# Patient Record
Sex: Female | Born: 1990 | Race: Black or African American | Hispanic: No | Marital: Single | Smoking: Current every day smoker
Health system: Southern US, Community
[De-identification: ages and names within clinical notes are randomized; demographics above are authoritative.]

## PROBLEM LIST (undated history)

## (undated) DIAGNOSIS — D573 Sickle-cell trait: Secondary | ICD-10-CM

## (undated) DIAGNOSIS — F319 Bipolar disorder, unspecified: Secondary | ICD-10-CM

## (undated) DIAGNOSIS — N879 Dysplasia of cervix uteri, unspecified: Secondary | ICD-10-CM

## (undated) DIAGNOSIS — F419 Anxiety disorder, unspecified: Secondary | ICD-10-CM

## (undated) DIAGNOSIS — F329 Major depressive disorder, single episode, unspecified: Secondary | ICD-10-CM

## (undated) DIAGNOSIS — T7840XA Allergy, unspecified, initial encounter: Secondary | ICD-10-CM

## (undated) DIAGNOSIS — F32A Depression, unspecified: Secondary | ICD-10-CM

## (undated) DIAGNOSIS — D649 Anemia, unspecified: Secondary | ICD-10-CM

## (undated) HISTORY — DX: Bipolar disorder, unspecified: F31.9

## (undated) HISTORY — DX: Dysplasia of cervix uteri, unspecified: N87.9

## (undated) HISTORY — PX: GYNECOLOGIC CRYOSURGERY: SHX857

## (undated) HISTORY — DX: Major depressive disorder, single episode, unspecified: F32.9

## (undated) HISTORY — DX: Allergy, unspecified, initial encounter: T78.40XA

## (undated) HISTORY — DX: Anxiety disorder, unspecified: F41.9

## (undated) HISTORY — DX: Sickle-cell trait: D57.3

## (undated) HISTORY — DX: Depression, unspecified: F32.A

## (undated) HISTORY — DX: Anemia, unspecified: D64.9

## (undated) HISTORY — PX: COLPOSCOPY: SHX161

---
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
## 2010-09-23 ENCOUNTER — Ambulatory Visit: Payer: Self-pay | Admitting: Gynecology

## 2011-02-28 ENCOUNTER — Other Ambulatory Visit (HOSPITAL_COMMUNITY)
Admission: RE | Admit: 2011-02-28 | Discharge: 2011-02-28 | Disposition: A | Discharge status: Home / Self Care | Payer: PRIVATE HEALTH INSURANCE | Source: Ambulatory Visit | Attending: Gynecology | Admitting: Gynecology

## 2011-02-28 ENCOUNTER — Ambulatory Visit (INDEPENDENT_AMBULATORY_CARE_PROVIDER_SITE_OTHER): Payer: PRIVATE HEALTH INSURANCE | Admitting: Gynecology

## 2011-02-28 ENCOUNTER — Encounter: Payer: Self-pay | Admitting: Gynecology

## 2011-02-28 VITALS — BP 116/74 | Ht 65.0 in | Wt 145.0 lb

## 2011-02-28 DIAGNOSIS — F329 Major depressive disorder, single episode, unspecified: Secondary | ICD-10-CM | POA: Insufficient documentation

## 2011-02-28 DIAGNOSIS — F319 Bipolar disorder, unspecified: Secondary | ICD-10-CM | POA: Insufficient documentation

## 2011-02-28 DIAGNOSIS — N926 Irregular menstruation, unspecified: Secondary | ICD-10-CM

## 2011-02-28 DIAGNOSIS — D649 Anemia, unspecified: Secondary | ICD-10-CM | POA: Insufficient documentation

## 2011-02-28 DIAGNOSIS — Z01419 Encounter for gynecological examination (general) (routine) without abnormal findings: Secondary | ICD-10-CM | POA: Insufficient documentation

## 2011-02-28 DIAGNOSIS — N920 Excessive and frequent menstruation with regular cycle: Secondary | ICD-10-CM

## 2011-02-28 DIAGNOSIS — Z113 Encounter for screening for infections with a predominantly sexual mode of transmission: Secondary | ICD-10-CM

## 2011-02-28 DIAGNOSIS — Z131 Encounter for screening for diabetes mellitus: Secondary | ICD-10-CM

## 2011-02-28 DIAGNOSIS — Z13220 Encounter for screening for lipoid disorders: Secondary | ICD-10-CM

## 2011-02-28 DIAGNOSIS — D573 Sickle-cell trait: Secondary | ICD-10-CM | POA: Insufficient documentation

## 2011-02-28 DIAGNOSIS — N879 Dysplasia of cervix uteri, unspecified: Secondary | ICD-10-CM | POA: Insufficient documentation

## 2011-02-28 LAB — URINALYSIS W MICROSCOPIC + REFLEX CULTURE
Bilirubin Urine: NEGATIVE
Urobilinogen, UA: 1 mg/dL (ref 0.0–1.0)

## 2011-02-28 NOTE — Progress Notes (Signed)
Regina Rojas 1990/10/10 [id number]        21 year old G2 P0 SAB 2 female presents for her annual exam as well as questions about irregular bleeding. Patient has history of using Depo-Provera through 2007 and then discontinuing. She had abnormal Pap smear which lead to cryosurgery in 2008 and it is unclear whether she had routine follow up since then as far as Pap smears are concerned. She notes now that her menses are every 2-3 months and when they come they're very heavy where she has bleedthrough episodes. She did achieve pregnancy in 2010 which lead to early SAB and no D&C and a separate pregnancy last year in 2012 at "four months" where she had a follow up D&C.  She is intermittently using condoms for contraception and would accept pregnancy if it occurred.  Past medical history,surgical history, medications, allergies, family history and social history were all reviewed and documented in the EPIC chart. ROS:  Was performed and pertinent positives and negatives are included in the history.  Exam: [name] chaperone present Filed Vitals:   02/28/11 1506  BP: 116/74   General appearance  Normal Skin grossly normal Head/Neck normal with no cervical or supraclavicular adenopathy thyroid normal Lungs  clear Cardiac RR, without RMG Abdominal  soft, nontender, without masses, organomegaly or hernia Breasts  examined lying and sitting without masses, retractions, discharge or axillary adenopathy. Pelvic  Ext/BUS/vagina  normal   Cervix  normal  Pap done GC and Chlamydia screen  Uterus  anteverted, normal size, shape and contour, midline and mobile nontender   Adnexa  Without masses or tenderness    Anus and perineum  normal   Rectovaginal  normal sphincter tone without palpated masses or tenderness.    Assessment/Plan:  21 y.o. female for annual exam.    1. Irregular/heavy menses. We'll start with baseline labs to include FSH TSH prolactin and hCG. Rule out structural abnormalities with  sonohysterogram and she will schedule this. 2. History cryosurgery 2008.  Follow up not well documented. Pap done today and we'll recommend annual cytology for now. 3. STD screening. A GC and Chlamydia screen were done. I offered serum screening she declined stating she recently had a negative HIV declined other blood work screening. 4. Birth control. Using condoms for now. Discussed and offered alternatives. Need to be on a multivitamin with folic acid if she chooses not to use contraception reviewed with her. We'll further discuss in follow up with her hormone and ultrasound studies. 5. Breast health. SBE monthly reviewed. 6. Health maintenance. Baseline CBC glucose lipid profile urinalysis ordered.     Regina Lords MD, 4:33 PM 02/28/2011

## 2011-02-28 NOTE — Patient Instructions (Signed)
Follow up for ultrasound and hormone blood work.

## 2011-03-01 LAB — LIPID PANEL
LDL Cholesterol: 103 mg/dL — ABNORMAL HIGH (ref 0–99)
Total CHOL/HDL Ratio: 3.3 Ratio

## 2011-03-01 LAB — TSH: TSH: 1.536 u[IU]/mL (ref 0.350–4.500)

## 2011-03-01 LAB — CBC WITH DIFFERENTIAL/PLATELET
Basophils Absolute: 0 10*3/uL (ref 0.0–0.1)
Basophils Relative: 0 % (ref 0–1)
Eosinophils Absolute: 0 10*3/uL (ref 0.0–0.7)
Eosinophils Relative: 0 % (ref 0–5)
Lymphocytes Relative: 24 % (ref 12–46)
Lymphs Abs: 2.3 10*3/uL (ref 0.7–4.0)
MCHC: 33.4 g/dL (ref 30.0–36.0)
Neutrophils Relative %: 70 % (ref 43–77)
Platelets: 292 10*3/uL (ref 150–400)
RDW: 13.1 % (ref 11.5–15.5)

## 2011-03-01 LAB — PROLACTIN: Prolactin: 13.2 ng/mL

## 2011-03-01 LAB — GLUCOSE, RANDOM: Glucose, Bld: 79 mg/dL (ref 70–99)

## 2013-11-18 ENCOUNTER — Encounter: Payer: Self-pay | Admitting: Gynecology

## 2014-06-24 ENCOUNTER — Encounter (HOSPITAL_BASED_OUTPATIENT_CLINIC_OR_DEPARTMENT_OTHER): Payer: Self-pay | Admitting: *Deleted

## 2014-06-24 ENCOUNTER — Emergency Department (HOSPITAL_BASED_OUTPATIENT_CLINIC_OR_DEPARTMENT_OTHER)
Admission: EM | Admit: 2014-06-24 | Discharge: 2014-06-24 | Disposition: A | Discharge status: Home / Self Care | Payer: PRIVATE HEALTH INSURANCE | Attending: Emergency Medicine | Admitting: Emergency Medicine

## 2014-06-24 DIAGNOSIS — Z862 Personal history of diseases of the blood and blood-forming organs and certain disorders involving the immune mechanism: Secondary | ICD-10-CM | POA: Insufficient documentation

## 2014-06-24 DIAGNOSIS — K002 Abnormalities of size and form of teeth: Secondary | ICD-10-CM | POA: Insufficient documentation

## 2014-06-24 DIAGNOSIS — Z8659 Personal history of other mental and behavioral disorders: Secondary | ICD-10-CM | POA: Insufficient documentation

## 2014-06-24 DIAGNOSIS — K088 Other specified disorders of teeth and supporting structures: Secondary | ICD-10-CM | POA: Insufficient documentation

## 2014-06-24 DIAGNOSIS — Z8742 Personal history of other diseases of the female genital tract: Secondary | ICD-10-CM | POA: Insufficient documentation

## 2014-06-24 DIAGNOSIS — K0381 Cracked tooth: Secondary | ICD-10-CM | POA: Insufficient documentation

## 2014-06-24 DIAGNOSIS — R509 Fever, unspecified: Secondary | ICD-10-CM | POA: Insufficient documentation

## 2014-06-24 DIAGNOSIS — K029 Dental caries, unspecified: Secondary | ICD-10-CM | POA: Insufficient documentation

## 2014-06-24 DIAGNOSIS — Z720 Tobacco use: Secondary | ICD-10-CM | POA: Insufficient documentation

## 2014-06-24 DIAGNOSIS — K0889 Other specified disorders of teeth and supporting structures: Secondary | ICD-10-CM

## 2014-06-24 MED ORDER — OXYCODONE-ACETAMINOPHEN 5-325 MG PO TABS: 2.0000 | ORAL_TABLET | ORAL | Status: DC | PRN

## 2014-06-24 MED ORDER — OXYCODONE-ACETAMINOPHEN 5-325 MG PO TABS
2.0000 | ORAL_TABLET | Freq: Once | ORAL | Status: AC
  Administered 2014-06-24: 2 via ORAL
  Filled 2014-06-24: qty 2

## 2014-06-24 MED ORDER — AMOXICILLIN 500 MG PO CAPS: 500.0000 mg | ORAL_CAPSULE | Freq: Three times a day (TID) | ORAL | Status: DC

## 2014-06-24 NOTE — ED Provider Notes (Signed)
CSN: [id number]     Arrival date & time 06/24/14  1958 History   None    Chief Complaint  Patient presents with  . Dental Pain     (Consider location/radiation/quality/duration/timing/severity/associated sxs/prior Treatment) Patient is a 24 y.o. female presenting with tooth pain. The history is provided by the patient. No language interpreter was used.  Dental Pain Associated symptoms: fever   Associated symptoms: no drooling    Regina Rojas is a 24 year old female who presents for dental pain that has been intermittent for the past couple of weeks but worse and constant in the last 2 days. She states that she has had similar pain on the other side of her mouth and was given and about for tooth abscess. She would like a referral to a dentist also. She states she has tried ibuprofen and Orajel with temporary relief. She states she has been having intermittent subjective fever. She denies any chills, drooling, throat pain, facial swelling, tongue swelling, difficulty breathing, nausea, vomiting. Denies any new tooth avulsion or fracture. Past Medical History  Diagnosis Date  . Sickle cell trait   . Depression   . Bipolar disorder   . Anemia   . Cervical dysplasia    Past Surgical History  Procedure Laterality Date  . Gynecologic cryosurgery    . Colposcopy     Family History  Problem Relation Age of Onset  . Diabetes Mother   . Hypertension Mother   . Hypertension Sister   . Heart disease Maternal Grandmother    History  Substance Use Topics  . Smoking status: Current Every Day Smoker -- 1.00 packs/day    Types: Cigarettes  . Smokeless tobacco: Not on file  . Alcohol Use: Yes     Comment: occas   OB History    Gravida Para Term Preterm AB TAB SAB Ectopic Multiple Living   2    2  2    0     Review of Systems  Constitutional: Positive for fever. Negative for chills.  HENT: Positive for dental problem. Negative for drooling.       Allergies  Review of patient's  allergies indicates no known allergies.  Home Medications   Prior to Admission medications   Medication Sig Start Date End Date Taking? Authorizing Provider  amoxicillin (AMOXIL) 500 MG capsule Take 1 capsule (500 mg total) by mouth 3 (three) times daily. 06/24/14   Aliviya Schoeller Patel-Mills, PA-C  oxyCODONE-acetaminophen (PERCOCET/ROXICET) 5-325 MG per tablet Take 2 tablets by mouth every 4 (four) hours as needed for severe pain. 06/24/14   Adolpho Meenach Patel-Mills, PA-C   BP 116/83 mmHg  Pulse 63  Temp(Src) 98 F (36.7 C) (Oral)  Resp 20  Wt 155 lb (70.308 kg)  SpO2 98% Physical Exam  Constitutional: She is oriented to person, place, and time. She appears well-developed and well-nourished.  HENT:  Head: Normocephalic and atraumatic.  Mouth/Throat: Uvula is midline, oropharynx is clear and moist and mucous membranes are normal. Abnormal dentition. Dental caries present. No dental abscesses or uvula swelling. No oropharyngeal exudate, posterior oropharyngeal edema, posterior oropharyngeal erythema or tonsillar abscesses.    Cardiovascular: Normal rate and regular rhythm.   Pulmonary/Chest: Effort normal.  Neurological: She is alert and oriented to person, place, and time.  Skin: Skin is warm and dry.  Nursing note and vitals reviewed.   ED Course  Procedures (including critical care time) Labs Review Labs Reviewed - No data to display  Imaging Review No results found.   EKG Interpretation  None      MDM   Final diagnoses:  Pain, dental  Patient presents for dental pain that has been ongoing for weeks but worse in the last 2 days. She is in no acute distress. Her oxygen is 98% on room air. She is currently afebrile with no submandibular tenderness, no facial swelling, no drooling, no shortness of breath, no difficulty breathing. I have prescribed amoxicillin and Percocet, and she can follow-up with [facility]. She verbally agrees with the plan.    Catha Gosselin, PA-C 06/24/14  1610  Gwyneth Sprout, MD 06/26/14 [phone]

## 2014-06-24 NOTE — Discharge Instructions (Signed)
Dental Pain Take medications as prescribed. Follow-up with Edmon CrapeJanna Civil for your dental pain. A tooth ache may be caused by cavities (tooth decay). Cavities expose the nerve of the tooth to air and hot or cold temperatures. It may come from an infection or abscess (also called a boil or furuncle) around your tooth. It is also often caused by dental caries (tooth decay). This causes the pain you are having. DIAGNOSIS  Your caregiver can diagnose this problem by exam. TREATMENT   If caused by an infection, it may be treated with medications which kill germs (antibiotics) and pain medications as prescribed by your caregiver. Take medications as directed.  Only take over-the-counter or prescription medicines for pain, discomfort, or fever as directed by your caregiver.  Whether the tooth ache today is caused by infection or dental disease, you should see your dentist as soon as possible for further care. SEEK MEDICAL CARE IF: The exam and treatment you received today has been provided on an emergency basis only. This is not a substitute for complete medical or dental care. If your problem worsens or new problems (symptoms) appear, and you are unable to meet with your dentist, call or return to this location. SEEK IMMEDIATE MEDICAL CARE IF:   You have a fever.  You develop redness and swelling of your face, jaw, or neck.  You are unable to open your mouth.  You have severe pain uncontrolled by pain medicine. MAKE SURE YOU:   Understand these instructions.  Will watch your condition.  Will get help right away if you are not doing well or get worse. Document Released: 01/03/2005 Document Revised: 03/28/2011 Document Reviewed: 08/22/2007 [facility] Patient Information 2015 [place]. This information is not intended to replace advice given to you by your health care provider. Make sure you discuss any questions you have with your health care provider.

## 2014-06-24 NOTE — ED Notes (Signed)
Dental pain. 

## 2014-06-24 NOTE — ED Notes (Signed)
PA at bedside for assessment

## 2014-07-02 ENCOUNTER — Emergency Department (HOSPITAL_BASED_OUTPATIENT_CLINIC_OR_DEPARTMENT_OTHER)
Admission: EM | Admit: 2014-07-02 | Discharge: 2014-07-02 | Disposition: A | Discharge status: Home / Self Care | Payer: Self-pay | Attending: Emergency Medicine | Admitting: Emergency Medicine

## 2014-07-02 ENCOUNTER — Emergency Department (HOSPITAL_BASED_OUTPATIENT_CLINIC_OR_DEPARTMENT_OTHER): Payer: Self-pay

## 2014-07-02 ENCOUNTER — Encounter (HOSPITAL_BASED_OUTPATIENT_CLINIC_OR_DEPARTMENT_OTHER): Payer: Self-pay | Admitting: *Deleted

## 2014-07-02 DIAGNOSIS — S93402A Sprain of unspecified ligament of left ankle, initial encounter: Secondary | ICD-10-CM | POA: Insufficient documentation

## 2014-07-02 DIAGNOSIS — Z792 Long term (current) use of antibiotics: Secondary | ICD-10-CM | POA: Insufficient documentation

## 2014-07-02 DIAGNOSIS — W1842XA Slipping, tripping and stumbling without falling due to stepping into hole or opening, initial encounter: Secondary | ICD-10-CM | POA: Insufficient documentation

## 2014-07-02 DIAGNOSIS — Z8659 Personal history of other mental and behavioral disorders: Secondary | ICD-10-CM | POA: Insufficient documentation

## 2014-07-02 DIAGNOSIS — Z880 Allergy status to penicillin: Secondary | ICD-10-CM | POA: Insufficient documentation

## 2014-07-02 DIAGNOSIS — Y998 Other external cause status: Secondary | ICD-10-CM | POA: Insufficient documentation

## 2014-07-02 DIAGNOSIS — Z720 Tobacco use: Secondary | ICD-10-CM | POA: Insufficient documentation

## 2014-07-02 DIAGNOSIS — Y9289 Other specified places as the place of occurrence of the external cause: Secondary | ICD-10-CM | POA: Insufficient documentation

## 2014-07-02 DIAGNOSIS — Z8742 Personal history of other diseases of the female genital tract: Secondary | ICD-10-CM | POA: Insufficient documentation

## 2014-07-02 DIAGNOSIS — Y9389 Activity, other specified: Secondary | ICD-10-CM | POA: Insufficient documentation

## 2014-07-02 DIAGNOSIS — Z862 Personal history of diseases of the blood and blood-forming organs and certain disorders involving the immune mechanism: Secondary | ICD-10-CM | POA: Insufficient documentation

## 2014-07-02 MED ORDER — NAPROXEN 500 MG PO TABS
500.0000 mg | ORAL_TABLET | Freq: Two times a day (BID) | ORAL | Status: DC
Start: 2014-07-02 — End: 2014-09-07

## 2014-07-02 MED ORDER — NAPROXEN 250 MG PO TABS
500.0000 mg | ORAL_TABLET | Freq: Once | ORAL | Status: AC
  Administered 2014-07-02: 500 mg via ORAL
  Filled 2014-07-02: qty 2

## 2014-07-02 NOTE — ED Provider Notes (Signed)
CSN: [id number]     Arrival date & time 07/02/14  1345 History   First MD Initiated Contact with Patient 07/02/14 1400     Chief Complaint  Patient presents with  . Ankle Injury    left     (Consider location/radiation/quality/duration/timing/severity/associated sxs/prior Treatment) HPI Comments: 24 year old female complaining of left ankle pain after stepping in a hold of by her dog last night causing her to twist it. States she immediately became in severe pain, and her ankle started to swell. Pain 8/10, worse with walking or movement. She has not tried any alleviating factors other than no pressure. No numbness or tingling.  Patient is a 24 y.o. female presenting with lower extremity injury. The history is provided by the patient.  Ankle Injury Pertinent negatives include no numbness.    Past Medical History  Diagnosis Date  . Sickle cell trait   . Depression   . Bipolar disorder   . Anemia   . Cervical dysplasia    Past Surgical History  Procedure Laterality Date  . Gynecologic cryosurgery    . Colposcopy     Family History  Problem Relation Age of Onset  . Diabetes Mother   . Hypertension Mother   . Hypertension Sister   . Heart disease Maternal Grandmother    History  Substance Use Topics  . Smoking status: Current Every Day Smoker -- 1.00 packs/day    Types: Cigarettes  . Smokeless tobacco: Never Used  . Alcohol Use: Yes     Comment: occas   OB History    Gravida Para Term Preterm AB TAB SAB Ectopic Multiple Living   2    2  2    0     Review of Systems  Constitutional: Negative.   Musculoskeletal:       + L ankle pain and swelling.  Skin: Negative for color change.  Neurological: Negative for numbness.      Allergies  Penicillins  Home Medications   Prior to Admission medications   Medication Sig Start Date End Date Taking? Authorizing Provider  amoxicillin (AMOXIL) 500 MG capsule Take 1 capsule (500 mg total) by mouth 3 (three) times daily.  06/24/14   Hanna Patel-Mills, PA-C  naproxen (NAPROSYN) 500 MG tablet Take 1 tablet (500 mg total) by mouth 2 (two) times daily. 07/02/14   Kathrynn Speed, PA-C  oxyCODONE-acetaminophen (PERCOCET/ROXICET) 5-325 MG per tablet Take 2 tablets by mouth every 4 (four) hours as needed for severe pain. 06/24/14   Hanna Patel-Mills, PA-C   BP 120/81 mmHg  Pulse 82  Temp(Src) 98.6 F (37 C) (Oral)  Resp 18  Ht 5\' 2"  (1.575 m)  Wt 172 lb (78.019 kg)  BMI 31.45 kg/m2  SpO2 100% Physical Exam  Constitutional: She is oriented to person, place, and time. She appears well-developed and well-nourished. No distress.  HENT:  Head: Normocephalic and atraumatic.  Mouth/Throat: Oropharynx is clear and moist.  Eyes: Conjunctivae and EOM are normal.  Neck: Normal range of motion. Neck supple.  Cardiovascular: Normal rate, regular rhythm and normal heart sounds.   Pulmonary/Chest: Effort normal and breath sounds normal. No respiratory distress.  Musculoskeletal:  L ankle TTP over lateral malleolus and AITFL with moderate swelling. ROM limited by pain. Able to wiggle toes. Achilles tendon normal. +2 PT/DP pulse.  Neurological: She is alert and oriented to person, place, and time. No sensory deficit.  Skin: Skin is warm and dry.  Psychiatric: She has a normal mood and affect. Her behavior  is normal.  Nursing note and vitals reviewed.   ED Course  Procedures (including critical care time) Labs Review Labs Reviewed - No data to display  Imaging Review Dg Ankle Complete Left  07/02/2014   CLINICAL DATA:  Rolled left ankle today stepping in hole. Left ankle pain. Initial encounter.  EXAM: LEFT ANKLE COMPLETE - 3+ VIEW  COMPARISON:  None.  FINDINGS: Lateral soft tissue swelling. There is no evidence of fracture, dislocation, or joint effusion.  IMPRESSION: Soft tissue swelling without osseous abnormality.   Electronically Signed   By: Marnee Spring M.D.   On: 07/02/2014 14:17     EKG Interpretation None       MDM   Final diagnoses:  Left ankle sprain, initial encounter   Neurovascularly intact. X-ray showing soft tissue swelling without rashes abnormality. Aircast splint applied and crutches given. Advised rice and NSAIDs. Stable for discharge. Return precautions given. Patient states understanding of treatment care plan and is agreeable.   Kathrynn Speed, PA-C 07/02/14 1427  Gilda Crease, MD 07/04/14 2052

## 2014-07-02 NOTE — ED Notes (Addendum)
Patient states she stepped in a hole last night and twisted her left ankle.  Moderate swelling noted on lateral malleolus.

## 2014-07-02 NOTE — Discharge Instructions (Signed)
Take naproxen as prescribed. Rest, ice and elevate your ankle. ° °Ankle Sprain °An ankle sprain is an injury to the strong, fibrous tissues (ligaments) that hold the bones of your ankle joint together.  °CAUSES °An ankle sprain is usually caused by a fall or by twisting your ankle. Ankle sprains most commonly occur when you step on the outer edge of your foot, and your ankle turns inward. People who participate in sports are more prone to these types of injuries.  °SYMPTOMS  °· Pain in your ankle. The pain may be present at rest or only when you are trying to stand or walk. °· Swelling. °· Bruising. Bruising may develop immediately or within 1 to 2 days after your injury. °· Difficulty standing or walking, particularly when turning corners or changing directions. °DIAGNOSIS  °Your caregiver will ask you details about your injury and perform a physical exam of your ankle to determine if you have an ankle sprain. During the physical exam, your caregiver will press on and apply pressure to specific areas of your foot and ankle. Your caregiver will try to move your ankle in certain ways. An X-ray exam may be done to be sure a bone was not broken or a ligament did not separate from one of the bones in your ankle (avulsion fracture).  °TREATMENT  °Certain types of braces can help stabilize your ankle. Your caregiver can make a recommendation for this. Your caregiver may recommend the use of medicine for pain. If your sprain is severe, your caregiver may refer you to a surgeon who helps to restore function to parts of your skeletal system (orthopedist) or a physical therapist. °HOME CARE INSTRUCTIONS  °· Apply ice to your injury for 1-2 days or as directed by your caregiver. Applying ice helps to reduce inflammation and pain. °· Put ice in a plastic bag. °· Place a towel between your skin and the bag. °· Leave the ice on for 15-20 minutes at a time, every 2 hours while you are awake. °· Only take over-the-counter or  prescription medicines for pain, discomfort, or fever as directed by your caregiver. °· Elevate your injured ankle above the level of your heart as much as possible for 2-3 days. °· If your caregiver recommends crutches, use them as instructed. Gradually put weight on the affected ankle. Continue to use crutches or a cane until you can walk without feeling pain in your ankle. °· If you have a plaster splint, wear the splint as directed by your caregiver. Do not rest it on anything harder than a pillow for the first 24 hours. Do not put weight on it. Do not get it wet. You may take it off to take a shower or bath. °· You may have been given an elastic bandage to wear around your ankle to provide support. If the elastic bandage is too tight (you have numbness or tingling in your foot or your foot becomes cold and blue), adjust the bandage to make it comfortable. °· If you have an air splint, you may blow more air into it or let air out to make it more comfortable. You may take your splint off at night and before taking a shower or bath. Wiggle your toes in the splint several times per day to decrease swelling. °SEEK MEDICAL CARE IF:  °· You have rapidly increasing bruising or swelling. °· Your toes feel extremely cold or you lose feeling in your foot. °· Your pain is not relieved with medicine. °SEEK   IMMEDIATE MEDICAL CARE IF:  Your toes are numb or blue.  You have severe pain that is increasing. MAKE SURE YOU:   Understand these instructions.  Will watch your condition.  Will get help right away if you are not doing well or get worse. Document Released: 01/03/2005 Document Revised: 09/28/2011 Document Reviewed: 01/15/2011 [facility] Patient Information 2015 [place], [place]. This information is not intended to replace advice given to you by your health care provider. Make sure you discuss any questions you have with your health care provider. RICE: Routine Care for Injuries The routine care of many  injuries includes Rest, Ice, Compression, and Elevation (RICE). HOME CARE INSTRUCTIONS  Rest is needed to allow your body to heal. Routine activities can usually be resumed when comfortable. Injured tendons and bones can take up to 6 weeks to heal. Tendons are the cord-like structures that attach muscle to bone.  Ice following an injury helps keep the swelling down and reduces pain.  Put ice in a plastic bag.  Place a towel between your skin and the bag.  Leave the ice on for 15-20 minutes, 3-4 times a day, or as directed by your health care provider. Do this while awake, for the first 24 to 48 hours. After that, continue as directed by your caregiver.  Compression helps keep swelling down. It also gives support and helps with discomfort. If an elastic bandage has been applied, it should be removed and reapplied every 3 to 4 hours. It should not be applied tightly, but firmly enough to keep swelling down. Watch fingers or toes for swelling, bluish discoloration, coldness, numbness, or excessive pain. If any of these problems occur, remove the bandage and reapply loosely. Contact your caregiver if these problems continue.  Elevation helps reduce swelling and decreases pain. With extremities, such as the arms, hands, legs, and feet, the injured area should be placed near or above the level of the heart, if possible. SEEK IMMEDIATE MEDICAL CARE IF:  You have persistent pain and swelling.  You develop redness, numbness, or unexpected weakness.  Your symptoms are getting worse rather than improving after several days. These symptoms may indicate that further evaluation or further X-rays are needed. Sometimes, X-rays may not show a small broken bone (fracture) until 1 week or 10 days later. Make a follow-up appointment with your caregiver. Ask when your X-ray results will be ready. Make sure you get your X-ray results. Document Released: 04/17/2000 Document Revised: 01/08/2013 Document Reviewed:  06/04/2010 [facility] Patient Information 2015 [place], [place]. This information is not intended to replace advice given to you by your health care provider. Make sure you discuss any questions you have with your health care provider.

## 2014-09-06 ENCOUNTER — Emergency Department (HOSPITAL_BASED_OUTPATIENT_CLINIC_OR_DEPARTMENT_OTHER)
Admission: EM | Admit: 2014-09-06 | Discharge: 2014-09-07 | Disposition: A | Discharge status: Home / Self Care | Payer: Self-pay | Attending: Emergency Medicine | Admitting: Emergency Medicine

## 2014-09-06 ENCOUNTER — Encounter (HOSPITAL_BASED_OUTPATIENT_CLINIC_OR_DEPARTMENT_OTHER): Payer: Self-pay | Admitting: Emergency Medicine

## 2014-09-06 DIAGNOSIS — Z862 Personal history of diseases of the blood and blood-forming organs and certain disorders involving the immune mechanism: Secondary | ICD-10-CM | POA: Insufficient documentation

## 2014-09-06 DIAGNOSIS — Z87410 Personal history of cervical dysplasia: Secondary | ICD-10-CM | POA: Insufficient documentation

## 2014-09-06 DIAGNOSIS — R112 Nausea with vomiting, unspecified: Secondary | ICD-10-CM | POA: Insufficient documentation

## 2014-09-06 DIAGNOSIS — Z720 Tobacco use: Secondary | ICD-10-CM | POA: Insufficient documentation

## 2014-09-06 DIAGNOSIS — Z791 Long term (current) use of non-steroidal anti-inflammatories (NSAID): Secondary | ICD-10-CM | POA: Insufficient documentation

## 2014-09-06 DIAGNOSIS — Z8659 Personal history of other mental and behavioral disorders: Secondary | ICD-10-CM | POA: Insufficient documentation

## 2014-09-06 DIAGNOSIS — Z880 Allergy status to penicillin: Secondary | ICD-10-CM | POA: Insufficient documentation

## 2014-09-06 DIAGNOSIS — Z3202 Encounter for pregnancy test, result negative: Secondary | ICD-10-CM | POA: Insufficient documentation

## 2014-09-06 DIAGNOSIS — Z792 Long term (current) use of antibiotics: Secondary | ICD-10-CM | POA: Insufficient documentation

## 2014-09-06 LAB — COMPREHENSIVE METABOLIC PANEL
ALBUMIN: 4.2 g/dL (ref 3.5–5.0)
ALK PHOS: 71 U/L (ref 38–126)
ALT: 24 U/L (ref 14–54)
AST: 24 U/L (ref 15–41)
Anion gap: 11 (ref 5–15)
BUN: 11 mg/dL (ref 6–20)
CALCIUM: 9.3 mg/dL (ref 8.9–10.3)
CHLORIDE: 102 mmol/L (ref 101–111)
CO2: 25 mmol/L (ref 22–32)
Creatinine, Ser: 0.71 mg/dL (ref 0.44–1.00)
GFR calc Af Amer: >60 mL/min (ref >60)
GFR calc non Af Amer: >60 mL/min (ref >60)
GLUCOSE: 101 mg/dL — AB (ref 65–99)
Potassium: 3.4 mmol/L — ABNORMAL LOW (ref 3.5–5.1)
SODIUM: 138 mmol/L (ref 135–145)
Total Bilirubin: 0.3 mg/dL (ref 0.3–1.2)
Total Protein: 8 g/dL (ref 6.5–8.1)

## 2014-09-06 LAB — CBC
HCT: 41.3 % (ref 36.0–46.0)
Hemoglobin: 13.8 g/dL (ref 12.0–15.0)
MCH: 28.9 pg (ref 26.0–34.0)
MCHC: 33.4 g/dL (ref 30.0–36.0)
MCV: 86.4 fL (ref 78.0–100.0)
Platelets: 296 10*3/uL (ref 150–400)
RBC: 4.78 MIL/uL (ref 3.87–5.11)
RDW: 14.5 % (ref 11.5–15.5)
WBC: 9.1 10*3/uL (ref 4.0–10.5)

## 2014-09-06 LAB — URINALYSIS, ROUTINE W REFLEX MICROSCOPIC
Bilirubin Urine: NEGATIVE
GLUCOSE, UA: NEGATIVE mg/dL
HGB URINE DIPSTICK: NEGATIVE
Ketones, ur: NEGATIVE mg/dL
LEUKOCYTES UA: NEGATIVE
Nitrite: NEGATIVE
PH: 5.5 (ref 5.0–8.0)
PROTEIN: NEGATIVE mg/dL
Specific Gravity, Urine: 1.025 (ref 1.005–1.030)
Urobilinogen, UA: 0.2 mg/dL (ref 0.0–1.0)

## 2014-09-06 LAB — DIFFERENTIAL
BASOS ABS: 0 10*3/uL (ref 0.0–0.1)
Basophils Relative: 0 % (ref 0–1)
Eosinophils Absolute: 0.1 10*3/uL (ref 0.0–0.7)
Eosinophils Relative: 1 % (ref 0–5)
LYMPHS ABS: 3 10*3/uL (ref 0.7–4.0)
Lymphocytes Relative: 33 % (ref 12–46)
MONO ABS: 0.7 10*3/uL (ref 0.1–1.0)
MONOS PCT: 8 % (ref 3–12)
NEUTROS ABS: 5.2 10*3/uL (ref 1.7–7.7)
Neutrophils Relative %: 58 % (ref 43–77)

## 2014-09-06 LAB — LIPASE, BLOOD: LIPASE: 43 U/L (ref 22–51)

## 2014-09-06 LAB — PREGNANCY, URINE: Preg Test, Ur: NEGATIVE

## 2014-09-06 MED ORDER — SODIUM CHLORIDE 0.9 % IV BOLUS (SEPSIS)
1000.0000 mL | Freq: Once | INTRAVENOUS | Status: AC
Start: 2014-09-06 — End: 2014-09-07
  Administered 2014-09-06: 1000 mL via INTRAVENOUS

## 2014-09-06 MED ORDER — ONDANSETRON HCL 4 MG/2ML IJ SOLN
4.0000 mg | Freq: Once | INTRAMUSCULAR | Status: AC
  Administered 2014-09-06: 4 mg via INTRAVENOUS
  Filled 2014-09-06: qty 2

## 2014-09-06 NOTE — ED Provider Notes (Signed)
CSN: [id number]     Arrival date & time 09/06/14  2119 History  This chart was scribed for Paula Libra, MD by Budd Palmer, ED Scribe. This patient was seen in room MH08/MH08 and the patient's care was started at 11:14 PM.    Chief Complaint  Patient presents with  . Vomiting    The history is provided by the patient. No language interpreter was used.   HPI Comments: Regina Rojas is a 24 y.o. female who presents to the Emergency Department complaining of vomiting onset last night. She states the vomiting has lessened since onset, last episode about 30 minutes ago. She notes associated nausea (resolved), and mild cramping abdominal pain. Pt denies diarrhea and fever. She is having a headache.  Past Medical History  Diagnosis Date  . Sickle cell trait   . Depression   . Bipolar disorder   . Anemia   . Cervical dysplasia    Past Surgical History  Procedure Laterality Date  . Gynecologic cryosurgery    . Colposcopy     Family History  Problem Relation Age of Onset  . Diabetes Mother   . Hypertension Mother   . Hypertension Sister   . Heart disease Maternal Grandmother    Social History  Substance Use Topics  . Smoking status: Current Every Day Smoker -- 1.00 packs/day    Types: Cigarettes  . Smokeless tobacco: Never Used  . Alcohol Use: Yes     Comment: occas   OB History    Gravida Para Term Preterm AB TAB SAB Ectopic Multiple Living   2    2  2    0     Review of Systems  All other systems reviewed and are negative.   Allergies  Penicillins  Home Medications   Prior to Admission medications   Medication Sig Start Date End Date Taking? Authorizing Provider  amoxicillin (AMOXIL) 500 MG capsule Take 1 capsule (500 mg total) by mouth 3 (three) times daily. 06/24/14   Hanna Patel-Mills, PA-C  naproxen (NAPROSYN) 500 MG tablet Take 1 tablet (500 mg total) by mouth 2 (two) times daily. 07/02/14   Kathrynn Speed, PA-C  oxyCODONE-acetaminophen (PERCOCET/ROXICET) 5-325  MG per tablet Take 2 tablets by mouth every 4 (four) hours as needed for severe pain. 06/24/14   Hanna Patel-Mills, PA-C   BP 99/64 mmHg  Pulse 78  Temp(Src) 98.3 F (36.8 C) (Oral)  Resp 20  Ht 5\' 4"  (1.626 m)  Wt 175 lb (79.379 kg)  BMI 30.02 kg/m2  SpO2 98%  LMP 07/18/2014   Physical Exam General: Well-developed, well-nourished female in no acute distress; appearance consistent with age of record HENT: normocephalic; atraumatic Eyes: pupils equal, round and reactive to light; extraocular muscles intact Neck: supple Heart: regular rate and rhythm Lungs: clear to auscultation bilaterally Abdomen: soft; nondistended; tender; no masses or hepatosplenomegaly; bowel sounds present Extremities: No deformity; full range of motion; pulses normal Neurologic: Awake, alert and oriented; motor function intact in all extremities and symmetric; no facial droop Skin: Warm and dry Psychiatric: Normal mood and affect   ED Course  Procedures  DIAGNOSTIC STUDIES: Oxygen Saturation is 100% on RA, normal by my interpretation.    COORDINATION OF CARE: 11:17 PM - Discussed normal urinalysis. Discussed plans to order IV fluids and anti-nausea medication. Pt advised of plan for treatment and pt agrees.   MDM   Nursing notes and vitals signs, including pulse oximetry, reviewed.  Summary of this visit's results, reviewed by myself:  Labs:  Results for orders placed or performed during the hospital encounter of 09/06/14 (from the past 24 hour(s))  Urinalysis, Routine w reflex microscopic (not at [facility])     Status: None   Collection Time: 09/06/14  9:50 PM  Result Value Ref Range   Color, Urine YELLOW YELLOW   APPearance CLEAR CLEAR   Specific Gravity, Urine 1.025 1.005 - 1.030   pH 5.5 5.0 - 8.0   Glucose, UA NEGATIVE NEGATIVE mg/dL   Hgb urine dipstick NEGATIVE NEGATIVE   Bilirubin Urine NEGATIVE NEGATIVE   Ketones, ur NEGATIVE NEGATIVE mg/dL   Protein, ur NEGATIVE NEGATIVE mg/dL    Urobilinogen, UA 0.2 0.0 - 1.0 mg/dL   Nitrite NEGATIVE NEGATIVE   Leukocytes, UA NEGATIVE NEGATIVE  Pregnancy, urine     Status: None   Collection Time: 09/06/14  9:50 PM  Result Value Ref Range   Preg Test, Ur NEGATIVE NEGATIVE  Lipase, blood     Status: None   Collection Time: 09/06/14 10:05 PM  Result Value Ref Range   Lipase 43 22 - 51 U/L  Comprehensive metabolic panel     Status: Abnormal   Collection Time: 09/06/14 10:05 PM  Result Value Ref Range   Sodium 138 135 - 145 mmol/L   Potassium 3.4 (L) 3.5 - 5.1 mmol/L   Chloride 102 101 - 111 mmol/L   CO2 25 22 - 32 mmol/L   Glucose, Bld 101 (H) 65 - 99 mg/dL   BUN 11 6 - 20 mg/dL   Creatinine, Ser 1.61 0.44 - 1.00 mg/dL   Calcium 9.3 8.9 - 09.6 mg/dL   Total Protein 8.0 6.5 - 8.1 g/dL   Albumin 4.2 3.5 - 5.0 g/dL   AST 24 15 - 41 U/L   ALT 24 14 - 54 U/L   Alkaline Phosphatase 71 38 - 126 U/L   Total Bilirubin 0.3 0.3 - 1.2 mg/dL   GFR calc non Af Amer >60 >60 mL/min   GFR calc Af Amer >60 >60 mL/min   Anion gap 11 5 - 15  CBC     Status: None   Collection Time: 09/06/14 10:05 PM  Result Value Ref Range   WBC 9.1 4.0 - 10.5 K/uL   RBC 4.78 3.87 - 5.11 MIL/uL   Hemoglobin 13.8 12.0 - 15.0 g/dL   HCT 04.5 40.9 - 81.1 %   MCV 86.4 78.0 - 100.0 fL   MCH 28.9 26.0 - 34.0 pg   MCHC 33.4 30.0 - 36.0 g/dL   RDW 91.4 78.2 - 95.6 %   Platelets 296 150 - 400 K/uL  Differential     Status: None   Collection Time: 09/06/14 10:05 PM  Result Value Ref Range   Neutrophils Relative % 58 43 - 77 %   Neutro Abs 5.2 1.7 - 7.7 K/uL   Lymphocytes Relative 33 12 - 46 %   Lymphs Abs 3.0 0.7 - 4.0 K/uL   Monocytes Relative 8 3 - 12 %   Monocytes Absolute 0.7 0.1 - 1.0 K/uL   Eosinophils Relative 1 0 - 5 %   Eosinophils Absolute 0.1 0.0 - 0.7 K/uL   Basophils Relative 0 0 - 1 %   Basophils Absolute 0.0 0.0 - 0.1 K/uL   12:56 AM Drinking fluids without emesis.   Final diagnoses:  Nausea and vomiting in adult   I personally  performed the services described in this documentation, which was scribed in my presence. The recorded information has been reviewed  and is accurate.   Paula Libra, MD 09/07/14 [phone]

## 2014-09-06 NOTE — ED Notes (Signed)
Patient states that she ate a hamburger last night and since she has been having nausea and vomiting x 3 times today

## 2014-09-07 MED ORDER — HYDROCODONE-ACETAMINOPHEN 5-325 MG PO TABS
1.0000 | ORAL_TABLET | Freq: Once | ORAL | Status: AC
Start: 2014-09-07 — End: 2014-09-07
  Administered 2014-09-07: 1 via ORAL
  Filled 2014-09-07: qty 1

## 2014-09-07 MED ORDER — ONDANSETRON 8 MG PO TBDP: 8.0000 mg | ORAL_TABLET | Freq: Three times a day (TID) | ORAL | Status: DC | PRN

## 2014-09-07 NOTE — ED Notes (Signed)
Went in to talk to patient and she is asleep. Had to wake her up to reassess her nausea. The patient reports that she is not any better. The patient then falls back to sleep

## 2014-09-28 ENCOUNTER — Emergency Department (HOSPITAL_BASED_OUTPATIENT_CLINIC_OR_DEPARTMENT_OTHER): Payer: Self-pay

## 2014-09-28 ENCOUNTER — Encounter (HOSPITAL_BASED_OUTPATIENT_CLINIC_OR_DEPARTMENT_OTHER): Payer: Self-pay | Admitting: Emergency Medicine

## 2014-09-28 ENCOUNTER — Emergency Department (HOSPITAL_BASED_OUTPATIENT_CLINIC_OR_DEPARTMENT_OTHER)
Admission: EM | Admit: 2014-09-28 | Discharge: 2014-09-28 | Disposition: A | Discharge status: Home / Self Care | Payer: Self-pay | Attending: Emergency Medicine | Admitting: Emergency Medicine

## 2014-09-28 DIAGNOSIS — Y998 Other external cause status: Secondary | ICD-10-CM | POA: Insufficient documentation

## 2014-09-28 DIAGNOSIS — S99912A Unspecified injury of left ankle, initial encounter: Secondary | ICD-10-CM | POA: Insufficient documentation

## 2014-09-28 DIAGNOSIS — Z8659 Personal history of other mental and behavioral disorders: Secondary | ICD-10-CM | POA: Insufficient documentation

## 2014-09-28 DIAGNOSIS — Z880 Allergy status to penicillin: Secondary | ICD-10-CM | POA: Insufficient documentation

## 2014-09-28 DIAGNOSIS — G8929 Other chronic pain: Secondary | ICD-10-CM | POA: Insufficient documentation

## 2014-09-28 DIAGNOSIS — M25572 Pain in left ankle and joints of left foot: Secondary | ICD-10-CM

## 2014-09-28 DIAGNOSIS — Z87410 Personal history of cervical dysplasia: Secondary | ICD-10-CM | POA: Insufficient documentation

## 2014-09-28 DIAGNOSIS — Y9389 Activity, other specified: Secondary | ICD-10-CM | POA: Insufficient documentation

## 2014-09-28 DIAGNOSIS — Y929 Unspecified place or not applicable: Secondary | ICD-10-CM | POA: Insufficient documentation

## 2014-09-28 DIAGNOSIS — X58XXXA Exposure to other specified factors, initial encounter: Secondary | ICD-10-CM | POA: Insufficient documentation

## 2014-09-28 DIAGNOSIS — Z862 Personal history of diseases of the blood and blood-forming organs and certain disorders involving the immune mechanism: Secondary | ICD-10-CM | POA: Insufficient documentation

## 2014-09-28 DIAGNOSIS — Z720 Tobacco use: Secondary | ICD-10-CM | POA: Insufficient documentation

## 2014-09-28 MED ORDER — MELOXICAM 15 MG PO TABS: 15.0000 mg | ORAL_TABLET | Freq: Every day | ORAL | Status: DC

## 2014-09-28 MED ORDER — DICLOFENAC SODIUM 1 % TD GEL: 4.0000 g | Freq: Four times a day (QID) | TRANSDERMAL | Status: DC

## 2014-09-28 NOTE — ED Provider Notes (Signed)
CSN: [id number]     Arrival date & time 09/28/14  0030 History   First MD Initiated Contact with Patient 09/28/14 [phone]     Chief Complaint  Patient presents with  . Ankle Pain     (Consider location/radiation/quality/duration/timing/severity/associated sxs/prior Treatment) Patient is a 24 y.o. female presenting with ankle pain. The history is provided by the patient.  Ankle Pain Location:  Ankle Time since incident:  2 months Injury: yes   Mechanism of injury comment:  Twisted Ankle location:  L ankle Pain details:    Quality:  Aching   Radiates to:  Does not radiate   Severity:  Moderate   Onset quality:  Sudden   Timing:  Constant   Progression:  Unchanged Chronicity:  Chronic Relieved by:  Nothing Worsened by:  Nothing tried Associated symptoms: no back pain   Risk factors: no concern for non-accidental trauma     Past Medical History  Diagnosis Date  . Sickle cell trait   . Depression   . Bipolar disorder   . Anemia   . Cervical dysplasia    Past Surgical History  Procedure Laterality Date  . Gynecologic cryosurgery    . Colposcopy     Family History  Problem Relation Age of Onset  . Diabetes Mother   . Hypertension Mother   . Hypertension Sister   . Heart disease Maternal Grandmother    Social History  Substance Use Topics  . Smoking status: Current Every Day Smoker -- 1.00 packs/day    Types: Cigarettes  . Smokeless tobacco: Never Used  . Alcohol Use: Yes     Comment: occas   OB History    Gravida Para Term Preterm AB TAB SAB Ectopic Multiple Living   0     Review of Systems  Musculoskeletal: Negative for back pain.  All other systems reviewed and are negative.     Allergies  Penicillins  Home Medications   Prior to Admission medications   Medication Sig Start Date End Date Taking? Authorizing Provider  diclofenac sodium (VOLTAREN) 1 % GEL Apply 4 g topically 4 (four) times daily. 09/28/14   Cord Wilczynski, MD  ondansetron  (ZOFRAN ODT) 8 MG disintegrating tablet Take 1 tablet (8 mg total) by mouth every 8 (eight) hours as needed for nausea or vomiting. 09/07/14   John Molpus, MD   BP 100/81 mmHg  Pulse 83  Temp(Src) 97.8 F (36.6 C) (Oral)  Resp 16  Ht  (1.575 m)  Wt 164 lb (74.39 kg)  BMI 29.99 kg/m2  SpO2 100%  LMP 07/18/2014 Physical Exam  Constitutional: She is oriented to person, place, and time. She appears well-developed and well-nourished. No distress.  Sleeping in room upon entrance and arouses easily then goes back to sleep  HENT:  Head: Normocephalic and atraumatic.  Mouth/Throat: Oropharynx is clear and moist.  Eyes: Conjunctivae are normal. Pupils are equal, round, and reactive to light.  Neck: Normal range of motion. Neck supple.  Cardiovascular: Normal rate, regular rhythm and intact distal pulses.   Pulmonary/Chest: Effort normal and breath sounds normal. No respiratory distress. She has no wheezes. She has no rales.  Abdominal: Soft. Bowel sounds are normal. There is no tenderness.  Musculoskeletal: Normal range of motion.       Left ankle: Normal. She exhibits normal range of motion, no swelling, no ecchymosis, no deformity, no laceration and normal pulse. No tenderness. No lateral malleolus, no medial malleolus,  no AITFL, no CF ligament, no posterior TFL, no head of 5th metatarsal and no proximal fibula tenderness found. Achilles tendon normal.  Neurological: She is alert and oriented to person, place, and time.  Skin: Skin is warm and dry.  Psychiatric: She has a normal mood and affect.    ED Course  Procedures (including critical care time) Labs Review Labs Reviewed - No data to display  Imaging Review No results found. I have personally reviewed and evaluated these images and lab results as part of my medical decision-making.   EKG Interpretation None      MDM   Final diagnoses:  Ankle pain, chronic, left   No non verbal signs of pain.  Here with her  significant other who also is a patient   Mobic, ASO applied follow up with your PMD for ongoing care    Makylie Rivere, MD 09/28/14 0530

## 2014-09-28 NOTE — ED Notes (Signed)
Patient states that she "sprung" her left ankle last month and it has bothered her since.

## 2014-09-28 NOTE — Discharge Instructions (Signed)

## 2014-10-28 ENCOUNTER — Emergency Department (HOSPITAL_BASED_OUTPATIENT_CLINIC_OR_DEPARTMENT_OTHER)
Admission: EM | Admit: 2014-10-28 | Discharge: 2014-10-28 | Disposition: A | Discharge status: Home / Self Care | Payer: Self-pay | Attending: Emergency Medicine | Admitting: Emergency Medicine

## 2014-10-28 ENCOUNTER — Encounter (HOSPITAL_BASED_OUTPATIENT_CLINIC_OR_DEPARTMENT_OTHER): Payer: Self-pay | Admitting: Emergency Medicine

## 2014-10-28 DIAGNOSIS — K029 Dental caries, unspecified: Secondary | ICD-10-CM | POA: Insufficient documentation

## 2014-10-28 DIAGNOSIS — Z8659 Personal history of other mental and behavioral disorders: Secondary | ICD-10-CM | POA: Insufficient documentation

## 2014-10-28 DIAGNOSIS — Z87448 Personal history of other diseases of urinary system: Secondary | ICD-10-CM | POA: Insufficient documentation

## 2014-10-28 DIAGNOSIS — Z880 Allergy status to penicillin: Secondary | ICD-10-CM | POA: Insufficient documentation

## 2014-10-28 DIAGNOSIS — Z862 Personal history of diseases of the blood and blood-forming organs and certain disorders involving the immune mechanism: Secondary | ICD-10-CM | POA: Insufficient documentation

## 2014-10-28 DIAGNOSIS — Z79899 Other long term (current) drug therapy: Secondary | ICD-10-CM | POA: Insufficient documentation

## 2014-10-28 DIAGNOSIS — Z720 Tobacco use: Secondary | ICD-10-CM | POA: Insufficient documentation

## 2014-10-28 MED ORDER — OXYCODONE-ACETAMINOPHEN 5-325 MG PO TABS
1.0000 | ORAL_TABLET | Freq: Once | ORAL | Status: AC
  Administered 2014-10-28: 1 via ORAL
  Filled 2014-10-28: qty 1

## 2014-10-28 MED ORDER — AMOXICILLIN 500 MG PO CAPS
500.0000 mg | ORAL_CAPSULE | Freq: Once | ORAL | Status: AC
  Administered 2014-10-28: 500 mg via ORAL
  Filled 2014-10-28: qty 1

## 2014-10-28 MED ORDER — OXYCODONE-ACETAMINOPHEN 5-325 MG PO TABS: 1.0000 | ORAL_TABLET | Freq: Four times a day (QID) | ORAL | Status: DC | PRN

## 2014-10-28 MED ORDER — AMOXICILLIN 500 MG PO CAPS: 500.0000 mg | ORAL_CAPSULE | Freq: Two times a day (BID) | ORAL | Status: DC

## 2014-10-28 NOTE — Discharge Instructions (Signed)
Take amoxicillin twice daily for a week.   Take motrin for pain.  Take percocet for severe pain.   Call Dr. Russella Dar office today for appointment.   Return to ER if you have worse pain, fever, facial swelling.

## 2014-10-28 NOTE — ED Notes (Signed)
Pt reports right upper tooth pain, started last pm,

## 2014-10-28 NOTE — ED Provider Notes (Signed)
CSN: [id number]     Arrival date & time 10/28/14  0905 History   First MD Initiated Contact with Patient 10/28/14 0912     Chief Complaint  Patient presents with  . Dental Pain     (Consider location/radiation/quality/duration/timing/severity/associated sxs/prior Treatment) The history is provided by the patient.  Regina Rojas is a 24 y.o. female hx of sickle cell trait, dental problems here presenting with left upper tooth pain.  Pain has been intermittent for several months, worse yesterday. States that has throbbing pain radiate to left ear. Denies fevers. Denies trouble swallowing. Was seen here several months ago for dental caries. Doesn't have a dentist.    Past Medical History  Diagnosis Date  . Sickle cell trait (HCC)   . Depression   . Bipolar disorder (HCC)   . Anemia   . Cervical dysplasia    Past Surgical History  Procedure Laterality Date  . Gynecologic cryosurgery    . Colposcopy     Family History  Problem Relation Age of Onset  . Diabetes Mother   . Hypertension Mother   . Hypertension Sister   . Heart disease Maternal Grandmother    Social History  Substance Use Topics  . Smoking status: Current Every Day Smoker -- 1.00 packs/day    Types: Cigarettes  . Smokeless tobacco: Never Used  . Alcohol Use: Yes     Comment: occas   OB History    Gravida Para Term Preterm AB TAB SAB Ectopic Multiple Living   0     Review of Systems  HENT: Positive for dental problem.   All other systems reviewed and are negative.     Allergies  Penicillins  Home Medications   Prior to Admission medications   Medication Sig Start Date End Date Taking? Authorizing Provider  diclofenac sodium (VOLTAREN) 1 % GEL Apply 4 g topically 4 (four) times daily. 09/28/14   April Palumbo, MD  ondansetron (ZOFRAN ODT) 8 MG disintegrating tablet Take 1 tablet (8 mg total) by mouth every 8 (eight) hours as needed for nausea or vomiting. 09/07/14   John Molpus, MD    BP 113/61 mmHg  Pulse 71  Temp(Src) 98.2 F (36.8 C) (Oral)  Resp 20  Ht  (1.626 m)  Wt 176 lb (79.833 kg)  BMI 30.20 kg/m2  SpO2 100% Physical Exam  Constitutional: She is oriented to person, place, and time. She appears well-developed.  HENT:  Head: Normocephalic.  Bilateral TM nl. L upper molar with cavity. Mild swelling around the gums, no obvious periapical abscess. Floor or mouth nontender   Eyes: Pupils are equal, round, and reactive to light.  Neck:  Mild L cervical LAD   Cardiovascular: Normal rate, regular rhythm and normal heart sounds.   Pulmonary/Chest: Effort normal and breath sounds normal. No respiratory distress. She has no wheezes. She has no rales.  Abdominal: Soft. Bowel sounds are normal. She exhibits no distension. There is no tenderness. There is no rebound.  Musculoskeletal: Normal range of motion.  Neurological: She is alert and oriented to person, place, and time.  Skin: Skin is warm.  Psychiatric: She has a normal mood and affect. Her behavior is normal. Judgment and thought content normal.  Nursing note and vitals reviewed.   ED Course  Procedures (including critical care time) Labs Review Labs Reviewed - No data to display  Imaging Review No results found. I have personally reviewed and evaluated these images  and lab results as part of my medical decision-making.   EKG Interpretation None      MDM   Final diagnoses:  None    Regina Rojas is a 24 y.o. female here with L upper tooth pain likely from dental caries. No obvious periapical abscess, no signs of ludwig's. Has allergy to PCN (nausea) but has tolerated amoxicillin in the past. Will dc home with amoxicillin, 5 pills of percocet, dental referral.     Richardean Canal, MD 10/28/14 [phone]

## 2014-12-01 ENCOUNTER — Encounter (HOSPITAL_BASED_OUTPATIENT_CLINIC_OR_DEPARTMENT_OTHER): Payer: Self-pay

## 2014-12-01 DIAGNOSIS — F17210 Nicotine dependence, cigarettes, uncomplicated: Secondary | ICD-10-CM | POA: Insufficient documentation

## 2014-12-01 DIAGNOSIS — R112 Nausea with vomiting, unspecified: Secondary | ICD-10-CM | POA: Insufficient documentation

## 2014-12-01 DIAGNOSIS — R197 Diarrhea, unspecified: Secondary | ICD-10-CM | POA: Insufficient documentation

## 2014-12-01 DIAGNOSIS — R109 Unspecified abdominal pain: Secondary | ICD-10-CM | POA: Insufficient documentation

## 2014-12-01 DIAGNOSIS — Z3202 Encounter for pregnancy test, result negative: Secondary | ICD-10-CM | POA: Insufficient documentation

## 2014-12-01 DIAGNOSIS — Z792 Long term (current) use of antibiotics: Secondary | ICD-10-CM | POA: Insufficient documentation

## 2014-12-01 DIAGNOSIS — Z862 Personal history of diseases of the blood and blood-forming organs and certain disorders involving the immune mechanism: Secondary | ICD-10-CM | POA: Insufficient documentation

## 2014-12-01 DIAGNOSIS — Z8659 Personal history of other mental and behavioral disorders: Secondary | ICD-10-CM | POA: Insufficient documentation

## 2014-12-01 DIAGNOSIS — Z880 Allergy status to penicillin: Secondary | ICD-10-CM | POA: Insufficient documentation

## 2014-12-01 DIAGNOSIS — Z8742 Personal history of other diseases of the female genital tract: Secondary | ICD-10-CM | POA: Insufficient documentation

## 2014-12-01 LAB — URINE MICROSCOPIC-ADD ON

## 2014-12-01 LAB — URINALYSIS, ROUTINE W REFLEX MICROSCOPIC
BILIRUBIN URINE: NEGATIVE
GLUCOSE, UA: NEGATIVE mg/dL
Hgb urine dipstick: NEGATIVE
KETONES UR: NEGATIVE mg/dL
NITRITE: NEGATIVE
Protein, ur: NEGATIVE mg/dL
Specific Gravity, Urine: 1.022 (ref 1.005–1.030)
Urobilinogen, UA: 1 mg/dL (ref 0.0–1.0)
pH: 7 (ref 5.0–8.0)

## 2014-12-01 LAB — LIPASE, BLOOD: LIPASE: 38 U/L (ref 11–51)

## 2014-12-01 LAB — CBC
HCT: 38.1 % (ref 36.0–46.0)
Hemoglobin: 12.5 g/dL (ref 12.0–15.0)
MCH: 28.9 pg (ref 26.0–34.0)
MCHC: 32.8 g/dL (ref 30.0–36.0)
MCV: 88.2 fL (ref 78.0–100.0)
Platelets: 253 10*3/uL (ref 150–400)
RBC: 4.32 MIL/uL (ref 3.87–5.11)
RDW: 13.5 % (ref 11.5–15.5)
WBC: 7.7 10*3/uL (ref 4.0–10.5)

## 2014-12-01 LAB — DIFFERENTIAL
Basophils Absolute: 0 10*3/uL (ref 0.0–0.1)
Basophils Relative: 0 %
EOS PCT: 2 %
Eosinophils Absolute: 0.1 10*3/uL (ref 0.0–0.7)
Lymphocytes Relative: 36 %
Lymphs Abs: 2.7 10*3/uL (ref 0.7–4.0)
Monocytes Absolute: 0.6 10*3/uL (ref 0.1–1.0)
Monocytes Relative: 7 %
NEUTROS ABS: 4.3 10*3/uL (ref 1.7–7.7)
Neutrophils Relative %: 55 %

## 2014-12-01 LAB — COMPREHENSIVE METABOLIC PANEL
ALT: 16 U/L (ref 14–54)
AST: 18 U/L (ref 15–41)
Albumin: 4 g/dL (ref 3.5–5.0)
Alkaline Phosphatase: 69 U/L (ref 38–126)
Anion gap: 6 (ref 5–15)
BILIRUBIN TOTAL: 0.2 mg/dL — AB (ref 0.3–1.2)
BUN: 9 mg/dL (ref 6–20)
CHLORIDE: 104 mmol/L (ref 101–111)
CO2: 27 mmol/L (ref 22–32)
CREATININE: 0.61 mg/dL (ref 0.44–1.00)
Calcium: 8.8 mg/dL — ABNORMAL LOW (ref 8.9–10.3)
GFR calc Af Amer: >60 mL/min (ref >60)
Glucose, Bld: 96 mg/dL (ref 65–99)
Potassium: 3.7 mmol/L (ref 3.5–5.1)
Sodium: 137 mmol/L (ref 135–145)
Total Protein: 7.3 g/dL (ref 6.5–8.1)

## 2014-12-01 LAB — PREGNANCY, URINE: Preg Test, Ur: NEGATIVE

## 2014-12-01 NOTE — ED Notes (Signed)
Pt reports unable to void at this time-to ED WR with CCA kit

## 2014-12-01 NOTE — ED Notes (Signed)
C/o n/v/d since 430pm after eating hamburger approx 1pm

## 2014-12-02 ENCOUNTER — Emergency Department (HOSPITAL_BASED_OUTPATIENT_CLINIC_OR_DEPARTMENT_OTHER)
Admission: EM | Admit: 2014-12-02 | Discharge: 2014-12-02 | Disposition: A | Discharge status: Home / Self Care | Payer: Self-pay | Attending: Emergency Medicine | Admitting: Emergency Medicine

## 2014-12-02 DIAGNOSIS — R1110 Vomiting, unspecified: Secondary | ICD-10-CM

## 2014-12-02 DIAGNOSIS — R197 Diarrhea, unspecified: Secondary | ICD-10-CM

## 2014-12-02 MED ORDER — ONDANSETRON HCL 4 MG PO TABS: 4.0000 mg | ORAL_TABLET | Freq: Three times a day (TID) | ORAL | Status: DC | PRN

## 2014-12-02 MED ORDER — ONDANSETRON 4 MG PO TBDP
4.0000 mg | ORAL_TABLET | Freq: Once | ORAL | Status: AC
  Administered 2014-12-02: 4 mg via ORAL
  Filled 2014-12-02: qty 1

## 2014-12-02 NOTE — Discharge Instructions (Signed)
Nausea and Vomiting °Nausea means you feel sick to your stomach. Throwing up (vomiting) is a reflex where stomach contents come out of your mouth. °HOME CARE  °· Take medicine as told by your doctor. °· Do not force yourself to eat. However, you do need to drink fluids. °· If you feel like eating, eat a normal diet as told by your doctor. °¨ Eat rice, wheat, potatoes, bread, lean meats, yogurt, fruits, and vegetables. °¨ Avoid high-fat foods. °· Drink enough fluids to keep your pee (urine) clear or pale yellow. °· Ask your doctor how to replace body fluid losses (rehydrate). Signs of body fluid loss (dehydration) include: °¨ Feeling very thirsty. °¨ Dry lips and mouth. °¨ Feeling dizzy. °¨ Dark pee. °¨ Peeing less than normal. °¨ Feeling confused. °¨ Fast breathing or heart rate. °GET HELP RIGHT AWAY IF:  °· You have blood in your throw up. °· You have black or bloody poop (stool). °· You have a bad headache or stiff neck. °· You feel confused. °· You have bad belly (abdominal) pain. °· You have chest pain or trouble breathing. °· You do not pee at least once every 8 hours. °· You have cold, clammy skin. °· You keep throwing up after 24 to 48 hours. °· You have a fever. °MAKE SURE YOU:  °· Understand these instructions. °· Will watch your condition. °· Will get help right away if you are not doing well or get worse. °  °This information is not intended to replace advice given to you by your health care provider. Make sure you discuss any questions you have with your health care provider. °  °Document Released: 06/22/2007 Document Revised: 03/28/2011 Document Reviewed: 06/04/2010 °Elsevier Interactive Patient Education ©2016 [employer] ° °Diarrhea °Diarrhea is watery poop (stool). It can make you feel weak, tired, thirsty, or give you a dry mouth (signs of dehydration). Watery poop is a sign of another problem, most often an infection. It often lasts 2-3 days. It can last longer if it is a sign of something  serious. Take care of yourself as told by your doctor. °HOME CARE  °· Drink 1 cup (8 ounces) of fluid each time you have watery poop. °· Do not drink the following fluids: °¨ Those that contain simple sugars (fructose, glucose, galactose, lactose, sucrose, maltose). °¨ Sports drinks. °¨ Fruit juices. °¨ Whole milk products. °¨ Sodas. °¨ Drinks with caffeine (coffee, tea, soda) or alcohol. °· Oral rehydration solution may be used if the doctor says it is okay. You may make your own solution. Follow this recipe: °¨  - teaspoon table salt. °¨ ¾ teaspoon baking soda. °¨  teaspoon salt substitute containing potassium chloride. °¨ 1 tablespoons sugar. °¨ 1 liter (34 ounces) of water. °· Avoid the following foods: °¨ High fiber foods, such as raw fruits and vegetables. °¨ Nuts, seeds, and whole grain breads and cereals. °¨  Those that are sweetened with sugar alcohols (xylitol, sorbitol, mannitol). °· Try eating the following foods: °¨ Starchy foods, such as rice, toast, pasta, low-sugar cereal, oatmeal, baked potatoes, crackers, and bagels. °¨ Bananas. °¨ Applesauce. °· Eat probiotic-rich foods, such as yogurt and milk products that are fermented. °· Wash your hands well after each time you have watery poop. °· Only take medicine as told by your doctor. °· Take a warm bath to help lessen burning or pain from having watery poop. °GET HELP RIGHT AWAY IF:  °· You cannot drink fluids without throwing up (vomiting). °·   You keep throwing up. °· You have blood in your poop, or your poop looks black and tarry. °· You do not pee (urinate) in 6-8 hours, or there is only a small amount of very dark pee. °· You have belly (abdominal) pain that gets worse or stays in the same spot (localizes). °· You are weak, dizzy, confused, or light-headed. °· You have a very bad headache. °· Your watery poop gets worse or does not get better. °· You have a fever or lasting symptoms for more than 2-3 days. °· You have a fever and your symptoms  suddenly get worse. °MAKE SURE YOU:  °· Understand these instructions. °· Will watch your condition. °· Will get help right away if you are not doing well or get worse. °  °This information is not intended to replace advice given to you by your health care provider. Make sure you discuss any questions you have with your health care provider. °  °Document Released: 06/22/2007 Document Revised: 01/24/2014 Document Reviewed: 09/11/2011 °Elsevier Interactive Patient Education ©2016 [employer] ° °

## 2014-12-02 NOTE — ED Notes (Signed)
MD at bedside. 

## 2014-12-02 NOTE — ED Provider Notes (Signed)
CSN: [id number]     Arrival date & time 12/01/14  2035 History  By signing my name below, I, Budd PalmerVanessa Prueter, attest that this documentation has been prepared under the direction and in the presence of Loren Raceravid Gorman Safi, MD. Electronically Signed: Budd PalmerVanessa Prueter, ED Scribe. 12/02/2014. 12:29 AM.    Chief Complaint  Patient presents with  . Emesis   The history is provided by the patient. No language interpreter was used.   HPI Comments: Regina Rojas is a 24 y.o. female smoker at 1 ppd who presents to the Emergency Department complaining of emesis (3x) onset 8 hours ago. She reports associated intermittent diarrhea, nausea, and abdominal discomfort. Pt states she had a burger today for lunch around noon. She notes she feels better but is still nauseated. She states she has not had v/d since arriving in the ED. She notes she has not been able to eat or drink since the symptoms began. Pt denies fever, chills, hematemesis, and bloody stool. No fever or chills. No known sick contacts.  Past Medical History  Diagnosis Date  . Sickle cell trait (HCC)   . Depression   . Bipolar disorder (HCC)   . Anemia   . Cervical dysplasia    Past Surgical History  Procedure Laterality Date  . Gynecologic cryosurgery    . Colposcopy     Family History  Problem Relation Age of Onset  . Diabetes Mother   . Hypertension Mother   . Hypertension Sister   . Heart disease Maternal Grandmother    Social History  Substance Use Topics  . Smoking status: Current Every Day Smoker -- 1.00 packs/day    Types: Cigarettes  . Smokeless tobacco: Never Used  . Alcohol Use: Yes     Comment: occ   OB History    Gravida Para Term Preterm AB TAB SAB Ectopic Multiple Living   2    2  2    0     Review of Systems  Constitutional: Negative for fever and chills.  Respiratory: Negative for shortness of breath.   Gastrointestinal: Positive for nausea, vomiting, abdominal pain and diarrhea. Negative for constipation  and blood in stool.  Genitourinary: Negative for dysuria, frequency, hematuria, flank pain and pelvic pain.  Musculoskeletal: Negative for back pain.  Skin: Negative for rash and wound.  Neurological: Negative for dizziness, weakness, light-headedness, numbness and headaches.  All other systems reviewed and are negative.   Allergies  Penicillins  Home Medications   Prior to Admission medications   Medication Sig Start Date End Date Taking? Authorizing Provider  amoxicillin (AMOXIL) 500 MG capsule Take 500 mg by mouth 3 (three) times daily.   Yes Historical Provider, MD  ondansetron (ZOFRAN) 4 MG tablet Take 1 tablet (4 mg total) by mouth every 8 (eight) hours as needed for nausea or vomiting. 12/02/14   Loren Raceravid Demecia Northway, MD  oxyCODONE-acetaminophen (PERCOCET/ROXICET) 5-325 MG tablet Take 1 tablet by mouth every 6 (six) hours as needed for severe pain. 10/28/14   Richardean Canalavid H Yao, MD   BP 117/83 mmHg  Pulse 67  Temp(Src) 98.1 F (36.7 C)  Resp 16  Ht 5\' 3"  (1.6 m)  Wt 180 lb (81.647 kg)  BMI 31.89 kg/m2  SpO2 97% Physical Exam  Constitutional: She is oriented to person, place, and time. She appears well-developed and well-nourished. No distress.  Patient is resting comfortably, easily aroused.  HENT:  Head: Normocephalic and atraumatic.  Mouth/Throat: Oropharynx is clear and moist.  Moist mucous membranes  Eyes: EOM are normal. Pupils are equal, round, and reactive to light.  Neck: Normal range of motion. Neck supple.  Cardiovascular: Normal rate and regular rhythm.  Exam reveals no gallop and no friction rub.   No murmur heard. Pulmonary/Chest: Effort normal and breath sounds normal. No respiratory distress. She has no wheezes. She has no rales.  Abdominal: Soft. Bowel sounds are normal. She exhibits no distension and no mass. There is no tenderness. There is no rebound and no guarding.  Musculoskeletal: Normal range of motion. She exhibits no edema or tenderness.  No CVA  tenderness bilaterally.  Neurological: She is alert and oriented to person, place, and time.  Skin: Skin is warm and dry. No rash noted. No erythema.  Psychiatric: She has a normal mood and affect. Her behavior is normal.  Nursing note and vitals reviewed.   ED Course  Procedures  DIAGNOSTIC STUDIES: Oxygen Saturation is 100% on RA, normal by my interpretation.    COORDINATION OF CARE: 12:29 AM - Discussed plans to order anti-nausea medication and to see whether pt can keep down fluids. Pt advised of plan for treatment and pt agrees.  Labs Review Labs Reviewed  URINALYSIS, ROUTINE W REFLEX MICROSCOPIC (NOT AT [facility]) - Abnormal; Notable for the following:    Leukocytes, UA SMALL (*)    All other components within normal limits  COMPREHENSIVE METABOLIC PANEL - Abnormal; Notable for the following:    Calcium 8.8 (*)    Total Bilirubin 0.2 (*)    All other components within normal limits  PREGNANCY, URINE  LIPASE, BLOOD  CBC  DIFFERENTIAL  URINE MICROSCOPIC-ADD ON    Imaging Review No results found. I have personally reviewed and evaluated these images and lab results as part of my medical decision-making.   EKG Interpretation None      MDM   Final diagnoses:  Vomiting and diarrhea    I personally performed the services described in this documentation, which was scribed in my presence. The recorded information has been reviewed and is accurate.   Patient is well-appearing with no evidence of dehydration. She has a benign abdominal exam with no tenderness to palpation. She is drinking fluids without nausea or vomiting. Her laboratory workup is normal. We'll discharge home with nausea medication. Return precautions have been given.  Loren Racer, MD 12/02/14 [phone]

## 2015-06-25 ENCOUNTER — Emergency Department (HOSPITAL_BASED_OUTPATIENT_CLINIC_OR_DEPARTMENT_OTHER)
Admission: EM | Admit: 2015-06-25 | Discharge: 2015-06-25 | Disposition: A | Discharge status: Home / Self Care | Payer: Self-pay | Attending: Emergency Medicine | Admitting: Emergency Medicine

## 2015-06-25 ENCOUNTER — Emergency Department (HOSPITAL_BASED_OUTPATIENT_CLINIC_OR_DEPARTMENT_OTHER): Payer: Self-pay

## 2015-06-25 ENCOUNTER — Encounter (HOSPITAL_BASED_OUTPATIENT_CLINIC_OR_DEPARTMENT_OTHER): Payer: Self-pay | Admitting: Emergency Medicine

## 2015-06-25 DIAGNOSIS — R58 Hemorrhage, not elsewhere classified: Secondary | ICD-10-CM

## 2015-06-25 DIAGNOSIS — O030 Genital tract and pelvic infection following incomplete spontaneous abortion: Secondary | ICD-10-CM | POA: Insufficient documentation

## 2015-06-25 DIAGNOSIS — O039 Complete or unspecified spontaneous abortion without complication: Secondary | ICD-10-CM

## 2015-06-25 DIAGNOSIS — F17210 Nicotine dependence, cigarettes, uncomplicated: Secondary | ICD-10-CM | POA: Insufficient documentation

## 2015-06-25 DIAGNOSIS — O4692 Antepartum hemorrhage, unspecified, second trimester: Secondary | ICD-10-CM

## 2015-06-25 DIAGNOSIS — F319 Bipolar disorder, unspecified: Secondary | ICD-10-CM | POA: Insufficient documentation

## 2015-06-25 DIAGNOSIS — A599 Trichomoniasis, unspecified: Secondary | ICD-10-CM | POA: Insufficient documentation

## 2015-06-25 DIAGNOSIS — Z3A00 Weeks of gestation of pregnancy not specified: Secondary | ICD-10-CM | POA: Insufficient documentation

## 2015-06-25 LAB — URINALYSIS, ROUTINE W REFLEX MICROSCOPIC
Bilirubin Urine: NEGATIVE
Glucose, UA: NEGATIVE mg/dL
Ketones, ur: 15 mg/dL — AB
NITRITE: NEGATIVE
PROTEIN: NEGATIVE mg/dL
SPECIFIC GRAVITY, URINE: 1.026 (ref 1.005–1.030)
pH: 6 (ref 5.0–8.0)

## 2015-06-25 LAB — WET PREP, GENITAL
SPERM: NONE SEEN
YEAST WET PREP: NONE SEEN

## 2015-06-25 LAB — COMPREHENSIVE METABOLIC PANEL
ALK PHOS: 47 U/L (ref 38–126)
ALT: 11 U/L — AB (ref 14–54)
ANION GAP: 5 (ref 5–15)
AST: 18 U/L (ref 15–41)
Albumin: 3.3 g/dL — ABNORMAL LOW (ref 3.5–5.0)
BUN: 6 mg/dL (ref 6–20)
CHLORIDE: 107 mmol/L (ref 101–111)
CO2: 25 mmol/L (ref 22–32)
Calcium: 8.2 mg/dL — ABNORMAL LOW (ref 8.9–10.3)
Creatinine, Ser: 0.74 mg/dL (ref 0.44–1.00)
Glucose, Bld: 90 mg/dL (ref 65–99)
POTASSIUM: 3.2 mmol/L — AB (ref 3.5–5.1)
Sodium: 137 mmol/L (ref 135–145)
Total Bilirubin: 0.4 mg/dL (ref 0.3–1.2)
Total Protein: 5.8 g/dL — ABNORMAL LOW (ref 6.5–8.1)

## 2015-06-25 LAB — CBC WITH DIFFERENTIAL/PLATELET
Basophils Absolute: 0 10*3/uL (ref 0.0–0.1)
Basophils Relative: 0 %
EOS PCT: 1 %
Eosinophils Absolute: 0.1 10*3/uL (ref 0.0–0.7)
HCT: 40.8 % (ref 36.0–46.0)
HEMOGLOBIN: 14.1 g/dL (ref 12.0–15.0)
LYMPHS ABS: 3 10*3/uL (ref 0.7–4.0)
LYMPHS PCT: 32 %
MCH: 30.4 pg (ref 26.0–34.0)
MCHC: 34.6 g/dL (ref 30.0–36.0)
MCV: 87.9 fL (ref 78.0–100.0)
MONOS PCT: 8 %
Monocytes Absolute: 0.7 10*3/uL (ref 0.1–1.0)
Neutro Abs: 5.4 10*3/uL (ref 1.7–7.7)
Neutrophils Relative %: 59 %
PLATELETS: 287 10*3/uL (ref 150–400)
RBC: 4.64 MIL/uL (ref 3.87–5.11)
RDW: 13.3 % (ref 11.5–15.5)
WBC: 9.1 10*3/uL (ref 4.0–10.5)

## 2015-06-25 LAB — URINE MICROSCOPIC-ADD ON

## 2015-06-25 LAB — LIPASE, BLOOD: LIPASE: 28 U/L (ref 11–51)

## 2015-06-25 LAB — PREGNANCY, URINE: Preg Test, Ur: POSITIVE — AB

## 2015-06-25 MED ORDER — NAPROXEN 375 MG PO TABS: 375.0000 mg | ORAL_TABLET | Freq: Two times a day (BID) | ORAL | Status: DC

## 2015-06-25 MED ORDER — ONDANSETRON 4 MG PO TBDP
4.0000 mg | ORAL_TABLET | Freq: Once | ORAL | Status: AC
  Administered 2015-06-25: 4 mg via ORAL
  Filled 2015-06-25: qty 1

## 2015-06-25 MED ORDER — METRONIDAZOLE 500 MG PO TABS
2000.0000 mg | ORAL_TABLET | Freq: Once | ORAL | Status: AC
  Administered 2015-06-25: 2000 mg via ORAL
  Filled 2015-06-25: qty 4

## 2015-06-25 MED ORDER — TRAMADOL HCL 50 MG PO TABS: 50.0000 mg | ORAL_TABLET | Freq: Four times a day (QID) | ORAL | Status: DC | PRN

## 2015-06-25 NOTE — ED Notes (Signed)
PA at bedside discussing test results and discharge plan of care.

## 2015-06-25 NOTE — ED Notes (Signed)
Pt with no period for 3 months but states her periods are very irregular. Pt states she could be pregnant. Pt reports waking up "in a pool of blood" this morning and passing a "large clot" last night. Pt states she has been bleeding through a pad per hour today. Pt c/o low abd cramping also.

## 2015-06-25 NOTE — ED Notes (Signed)
Pt reports no period for 3 months, awoke this am with a lot of vaginal bleeding reports soaking almost 10 pads today, lower abdominal pain, no nausea or vomiting

## 2015-06-25 NOTE — Discharge Instructions (Signed)
Miscarriage °A miscarriage is the sudden loss of an unborn baby (fetus) before the 20th week of pregnancy. Most miscarriages happen in the first 3 months of pregnancy. Sometimes, it happens before a woman even knows she is pregnant. A miscarriage is also called a "spontaneous miscarriage" or "early pregnancy loss." Having a miscarriage can be an emotional experience. Talk with your caregiver about any questions you may have about miscarrying, the grieving process, and your future pregnancy plans. °CAUSES  °· Problems with the fetal chromosomes that make it impossible for the baby to develop normally. Problems with the baby's genes or chromosomes are most often the result of errors that occur, by chance, as the embryo divides and grows. The problems are not inherited from the parents. °· Infection of the cervix or uterus.   °· Hormone problems.   °· Problems with the cervix, such as having an incompetent cervix. This is when the tissue in the cervix is not strong enough to hold the pregnancy.   °· Problems with the uterus, such as an abnormally shaped uterus, uterine fibroids, or congenital abnormalities.   °· Certain medical conditions.   °· Smoking, drinking alcohol, or taking illegal drugs.   °· Trauma.   °Often, the cause of a miscarriage is unknown.  °SYMPTOMS  °· Vaginal bleeding or spotting, with or without cramps or pain. °· Pain or cramping in the abdomen or lower back. °· Passing fluid, tissue, or blood clots from the vagina. °DIAGNOSIS  °Your caregiver will perform a physical exam. You may also have an ultrasound to confirm the miscarriage. Blood or urine tests may also be ordered. °TREATMENT  °· Sometimes, treatment is not necessary if you naturally pass all the fetal tissue that was in the uterus. If some of the fetus or placenta remains in the body (incomplete miscarriage), tissue left behind may become infected and must be removed. Usually, a dilation and curettage (D and C) procedure is performed.  During a D and C procedure, the cervix is widened (dilated) and any remaining fetal or placental tissue is gently removed from the uterus. °· Antibiotic medicines are prescribed if there is an infection. Other medicines may be given to reduce the size of the uterus (contract) if there is a lot of bleeding. °· If you have Rh negative blood and your baby was Rh positive, you will need a Rh immunoglobulin shot. This shot will protect any future baby from having Rh blood problems in future pregnancies. °HOME CARE INSTRUCTIONS  °· Your caregiver may order bed rest or may allow you to continue light activity. Resume activity as directed by your caregiver. °· Have someone help with home and family responsibilities during this time.   °· Keep track of the number of sanitary pads you use each day and how soaked (saturated) they are. Write down this information.   °· Do not use tampons. Do not douche or have sexual intercourse until approved by your caregiver.   °· Only take over-the-counter or prescription medicines for pain or discomfort as directed by your caregiver.   °· Do not take aspirin. Aspirin can cause bleeding.   °· Keep all follow-up appointments with your caregiver.   °· If you or your partner have problems with grieving, talk to your caregiver or seek counseling to help cope with the pregnancy loss. Allow enough time to grieve before trying to get pregnant again.   °SEEK IMMEDIATE MEDICAL CARE IF:  °· You have severe cramps or pain in your back or abdomen. °· You have a fever. °· You pass large blood clots (walnut-sized   or larger) ortissue from your vagina. Save any tissue for your caregiver to inspect.   Your bleeding increases.   You have a thick, bad-smelling vaginal discharge.  You become lightheaded, weak, or you faint.   You have chills.  MAKE SURE YOU:  Understand these instructions.  Will watch your condition.  Will get help right away if you are not doing well or get worse.   This  information is not intended to replace advice given to you by your health care provider. Make sure you discuss any questions you have with your health care provider.   Document Released: 06/29/2000 Document Revised: 04/30/2012 Document Reviewed: 02/22/2011 Elsevier Interactive Patient Education 2016 [employer].  Trichomoniasis Trichomoniasis is an infection caused by an organism called Trichomonas. The infection can affect both women and men. In women, the outer female genitalia and the vagina are affected. In men, the penis is mainly affected, but the prostate and other reproductive organs can also be involved. Trichomoniasis is a sexually transmitted infection (STI) and is most often passed to another person through sexual contact.  RISK FACTORS  Having unprotected sexual intercourse.  Having sexual intercourse with an infected partner. SIGNS AND SYMPTOMS  Symptoms of trichomoniasis in women include:  Abnormal gray-green frothy vaginal discharge.  Itching and irritation of the vagina.  Itching and irritation of the area outside the vagina. Symptoms of trichomoniasis in men include:   Penile discharge with or without pain.  Pain during urination. This results from inflammation of the urethra. DIAGNOSIS  Trichomoniasis may be found during a Pap test or physical exam. Your health care provider may use one of the following methods to help diagnose this infection:  Testing the pH of the vagina with a test tape.  Using a vaginal swab test that checks for the Trichomonas organism. A test is available that provides results within a few minutes.  Examining a urine sample.  Testing vaginal secretions. Your health care provider may test you for other STIs, including HIV. TREATMENT   You may be given medicine to fight the infection. Women should inform their health care provider if they could be or are pregnant. Some medicines used to treat the infection should not be taken during  pregnancy.  Your health care provider may recommend over-the-counter medicines or creams to decrease itching or irritation.  Your sexual partner will need to be treated if infected.  Your health care provider may test you for infection again 3 months after treatment. HOME CARE INSTRUCTIONS   Take medicines only as directed by your health care provider.  Take over-the-counter medicine for itching or irritation as directed by your health care provider.  Do not have sexual intercourse while you have the infection.  Women should not douche or wear tampons while they have the infection.  Discuss your infection with your partner. Your partner may have gotten the infection from you, or you may have gotten it from your partner.  Have your sex partner get examined and treated if necessary.  Practice safe, informed, and protected sex.  See your health care provider for other STI testing. SEEK MEDICAL CARE IF:   You still have symptoms after you finish your medicine.  You develop abdominal pain.  You have pain when you urinate.  You have bleeding after sexual intercourse.  You develop a rash.  Your medicine makes you sick or makes you throw up (vomit). MAKE SURE YOU:  Understand these instructions.  Will watch your condition.  Will get help  right away if you are not doing well or get worse.   This information is not intended to replace advice given to you by your health care provider. Make sure you discuss any questions you have with your health care provider.   Document Released: 06/29/2000 Document Revised: 01/24/2014 Document Reviewed: 10/15/2012 Elsevier Interactive Patient Education [employer]2016 Elsevier Inc.

## 2015-06-25 NOTE — ED Provider Notes (Signed)
CSN: [id number]     Arrival date & time 06/25/15  1648 History   First MD Initiated Contact with Patient 06/25/15 1816     Chief Complaint  Patient presents with  . Abdominal Pain     (Consider location/radiation/quality/duration/timing/severity/associated sxs/prior Treatment) HPI Regina Rojas Is a 25 year old female who presents emergency Department with chief complaint of vaginal bleeding. Patient states that she has been off of her Depo-Provera for several months. She states that she had a period 3 months ago but has not had one since. Yesterday, she is having abdominal cramping. She got in the bathtub and passed a very large clot, which she described as gray and dark purple. The patient states they she seemed to be okay, got back into the bathtub. The (bleeding heavily. The bathtub with blood. She states that after that she has been bleeding heavily soaking about 1 pad per hour. She states she soaked, obese, 12 pads in the past several hours. The bleeding does seem to be swelling some. She denies nausea, vomiting. She is unsure if she is pregnant. She denies other vaginal*urinary symptoms. Past Medical History  Diagnosis Date  . Sickle cell trait (HCC)   . Depression   . Bipolar disorder (HCC)   . Anemia   . Cervical dysplasia    Past Surgical History  Procedure Laterality Date  . Gynecologic cryosurgery    . Colposcopy     Family History  Problem Relation Age of Onset  . Diabetes Mother   . Hypertension Mother   . Hypertension Sister   . Heart disease Maternal Grandmother    Social History  Substance Use Topics  . Smoking status: Current Every Day Smoker -- 1.00 packs/day    Types: Cigarettes  . Smokeless tobacco: Never Used  . Alcohol Use: Yes     Comment: occ   OB History    Gravida Para Term Preterm AB TAB SAB Ectopic Multiple Living   2    2  2    0     Review of Systems   Ten systems reviewed and are negative for acute change, except as noted in the HPI.    Allergies  Penicillins  Home Medications   Prior to Admission medications   Medication Sig Start Date End Date Taking? Authorizing Provider  naproxen (NAPROSYN) 375 MG tablet Take 1 tablet (375 mg total) by mouth 2 (two) times daily. 06/25/15   Arthor CaptainAbigail Brenden Rudman, PA-C  traMADol (ULTRAM) 50 MG tablet Take 1 tablet (50 mg total) by mouth every 6 (six) hours as needed. 06/25/15   Arthor CaptainAbigail Cap Massi, PA-C   BP 111/75 mmHg  Pulse 72  Temp(Src) 98.2 F (36.8 C) (Oral)  Resp 18  Ht 5\' 3"  (1.6 m)  Wt 78.926 kg  BMI 30.83 kg/m2  SpO2 99% Physical Exam Physical Exam  Nursing note and vitals reviewed. Constitutional: She is oriented to person, place, and time. She appears well-developed and well-nourished. No distress.  HENT:  Head: Normocephalic and atraumatic.  Eyes: Conjunctivae normal and EOM are normal. Pupils are equal, round, and reactive to light. No scleral icterus.  Neck: Normal range of motion.  Cardiovascular: Normal rate, regular rhythm and normal heart sounds.  Exam reveals no gallop and no friction rub.   No murmur heard. Pulmonary/Chest: Effort normal and breath sounds normal. No respiratory distress.  Abdominal: Soft. Bowel sounds are normal. She exhibits no distension and no mass. There is no tenderness. There is no guarding.  Neurological: She is alert and  oriented to person, place, and time.  GU: Normal external female genitalia. Normal vagina without discharge. Cervical ox dilated to 1 cm with large clot in the os. Removed easily with forceps.  No heavy bleeding after removal. G/C cervical swab and wet prep obtained. Diffuse tenderness on bimanual. Skin: Skin is warm and dry. She is not diaphoretic.    ED Course  Procedures (including critical care time) Labs Review Labs Reviewed  WET PREP, GENITAL - Abnormal; Notable for the following:    Trich, Wet Prep PRESENT (*)    Clue Cells Wet Prep HPF POC PRESENT (*)    WBC, Wet Prep HPF POC MODERATE (*)    All other  components within normal limits  PREGNANCY, URINE - Abnormal; Notable for the following:    Preg Test, Ur POSITIVE (*)    All other components within normal limits  COMPREHENSIVE METABOLIC PANEL - Abnormal; Notable for the following:    Potassium 3.2 (*)    Calcium 8.2 (*)    Total Protein 5.8 (*)    Albumin 3.3 (*)    ALT 11 (*)    All other components within normal limits  URINALYSIS, ROUTINE W REFLEX MICROSCOPIC (NOT AT [facility]) - Abnormal; Notable for the following:    Color, Urine BROWN (*)    APPearance CLOUDY (*)    Hgb urine dipstick LARGE (*)    Ketones, ur 15 (*)    Leukocytes, UA TRACE (*)    All other components within normal limits  URINE MICROSCOPIC-ADD ON - Abnormal; Notable for the following:    Squamous Epithelial / LPF 0-5 (*)    Bacteria, UA MANY (*)    All other components within normal limits  CBC WITH DIFFERENTIAL/PLATELET  LIPASE, BLOOD  RPR  HIV ANTIBODY (ROUTINE TESTING)  GC/CHLAMYDIA PROBE AMP ([place]) NOT AT [facility]    Imaging Review No results found. I have personally reviewed and evaluated these images and lab results as part of my medical decision-making.   EKG Interpretation None      MDM   Final diagnoses:  Bleeding  Spontaneous abortion in first trimester  Trichimoniasis    Patient with +pregnancy test. Appears to have had a spontaneous abortion. I have discussed her dx of trichimoniasis after I asked her husband to leave the room. I informed patient that her husband will need treatment for the same. Patient expresses her understanding. She will be disharged to follow up with OBgyn. Discussed reasons to seek immediate medical care at the ED. The patient appears reasonably screened and/or stabilized for discharge and I doubt any other medical condition or other [facility] requiring further screening, evaluation, or treatment in the ED at this time prior to discharge.      Arthor Captain, PA-C 06/28/15 1832  Rolland Porter, MD 07/11/15  1330

## 2015-06-25 NOTE — ED Notes (Signed)
Patient transported to Ultrasound 

## 2015-06-26 LAB — GC/CHLAMYDIA PROBE AMP (~~LOC~~) NOT AT ARMC
CHLAMYDIA, DNA PROBE: NEGATIVE
Neisseria Gonorrhea: NEGATIVE

## 2015-06-27 LAB — HIV ANTIBODY (ROUTINE TESTING W REFLEX): HIV SCREEN 4TH GENERATION: NONREACTIVE

## 2015-06-27 LAB — RPR: RPR: NONREACTIVE

## 2016-05-21 IMAGING — CR DG ANKLE COMPLETE 3+V*L*
3 series · 3 of 3 positions shown · non-contrast
Comparison: None.

CLINICAL DATA: Rolled left ankle today stepping in hole. Left ankle
pain. Initial encounter.

EXAM:
LEFT ANKLE COMPLETE - 3+ VIEW

[t ankle joint ap left]
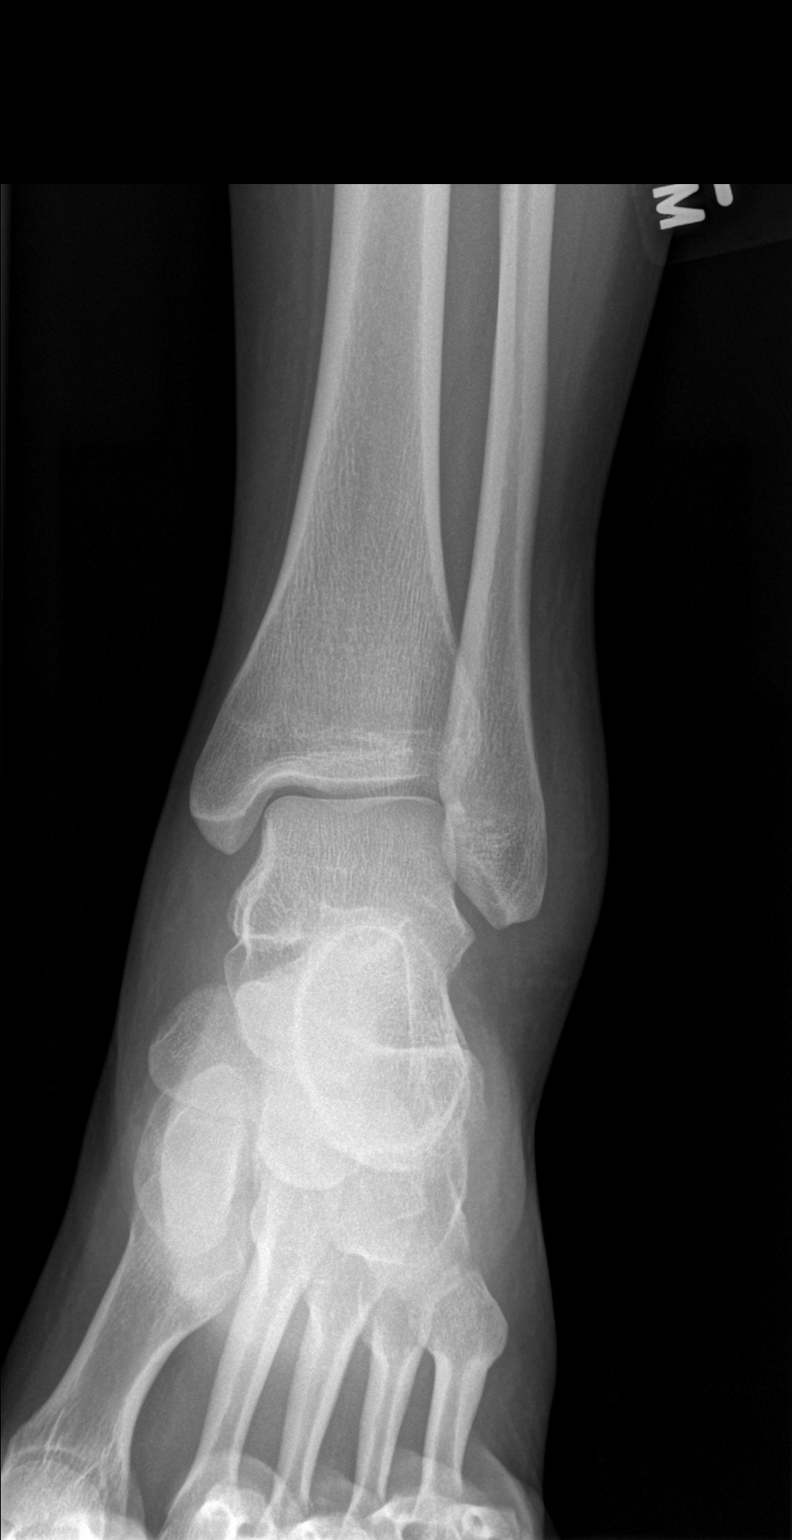

[t ankle joint oblique left]
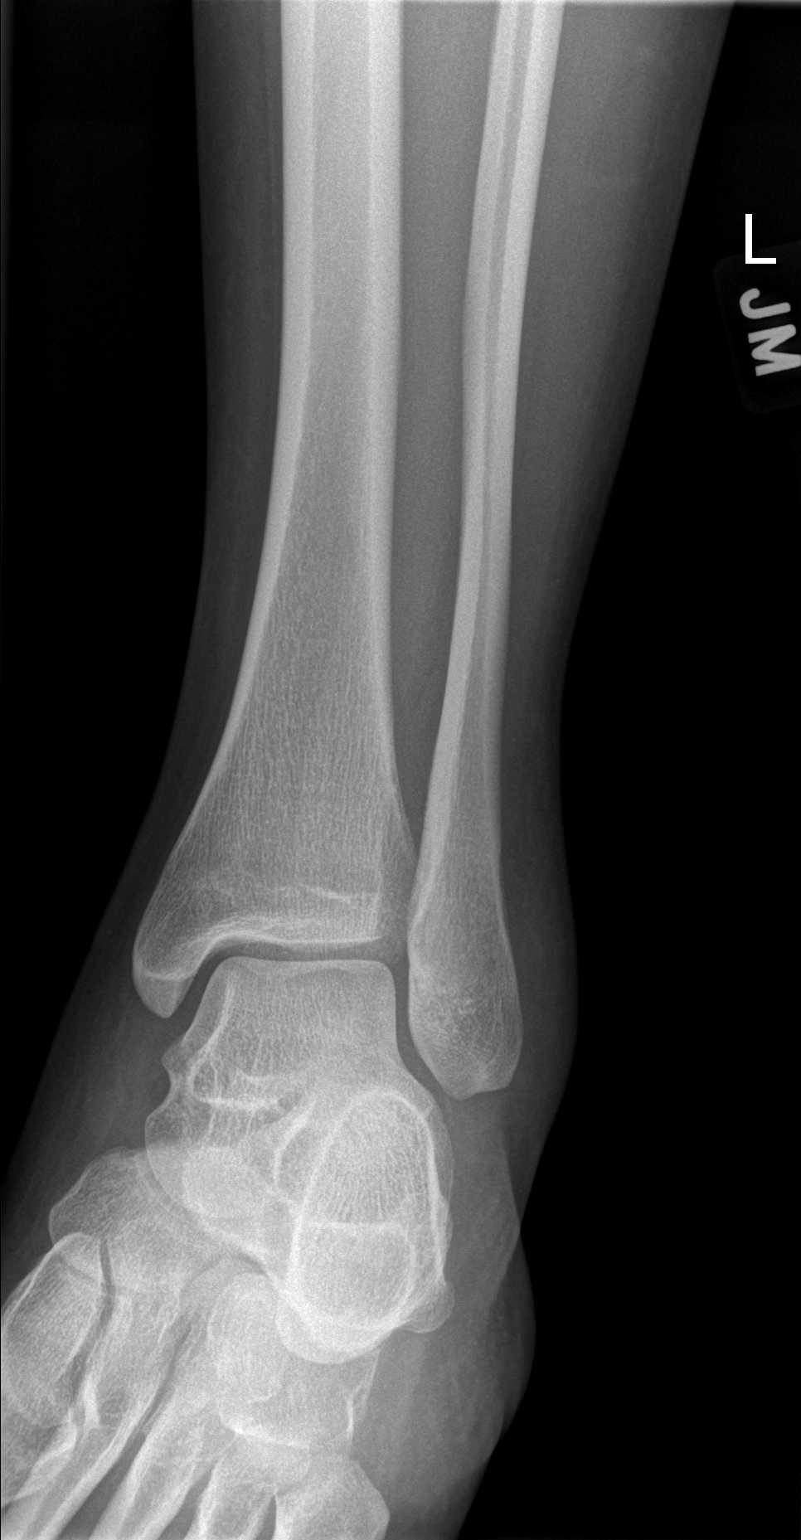

[t ankle joint lat left]
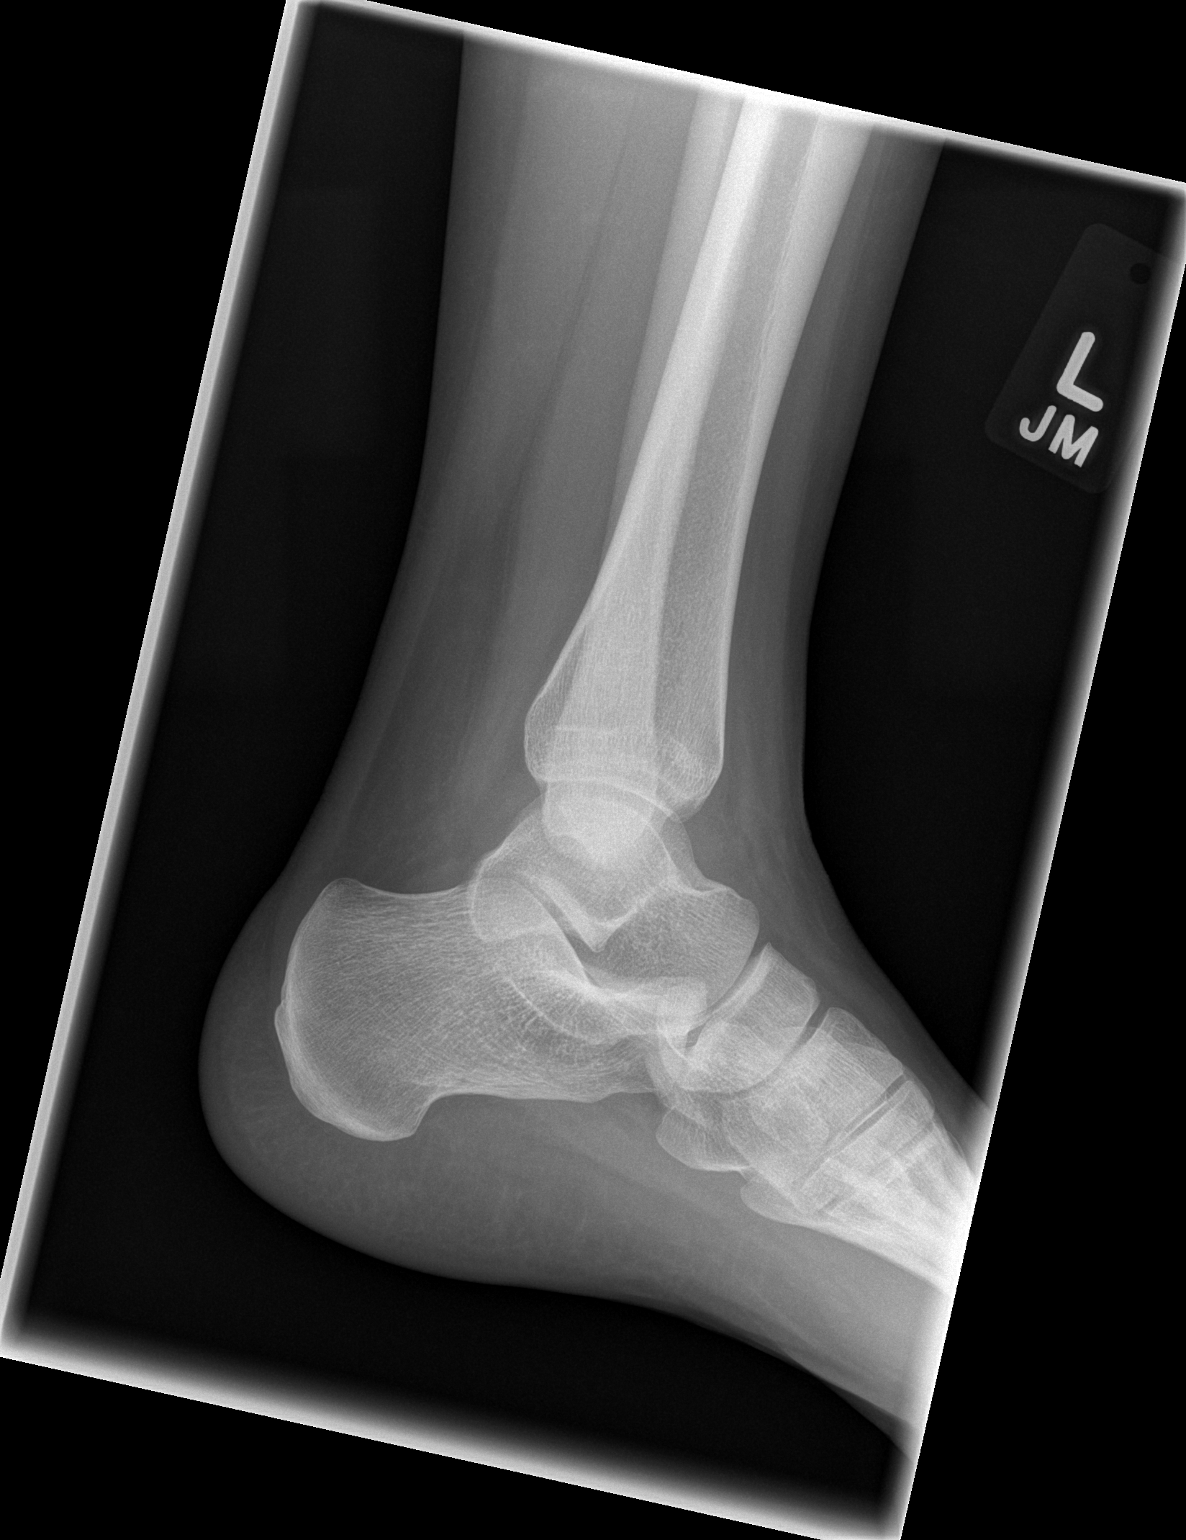

[3 of 3 positions shown; findings below may reference images not displayed]

FINDINGS: Lateral soft tissue swelling. There is no evidence of fracture,
dislocation, or joint effusion.
IMPRESSION: Soft tissue swelling without osseous abnormality.

## 2017-01-14 ENCOUNTER — Emergency Department (HOSPITAL_BASED_OUTPATIENT_CLINIC_OR_DEPARTMENT_OTHER)
Admission: EM | Admit: 2017-01-14 | Discharge: 2017-01-14 | Disposition: A | Discharge status: Home / Self Care | Payer: Self-pay | Attending: Emergency Medicine | Admitting: Emergency Medicine

## 2017-01-14 ENCOUNTER — Emergency Department (HOSPITAL_BASED_OUTPATIENT_CLINIC_OR_DEPARTMENT_OTHER): Payer: Self-pay

## 2017-01-14 ENCOUNTER — Other Ambulatory Visit: Payer: Self-pay

## 2017-01-14 ENCOUNTER — Encounter (HOSPITAL_BASED_OUTPATIENT_CLINIC_OR_DEPARTMENT_OTHER): Payer: Self-pay | Admitting: *Deleted

## 2017-01-14 DIAGNOSIS — F17210 Nicotine dependence, cigarettes, uncomplicated: Secondary | ICD-10-CM | POA: Insufficient documentation

## 2017-01-14 DIAGNOSIS — R42 Dizziness and giddiness: Secondary | ICD-10-CM | POA: Insufficient documentation

## 2017-01-14 DIAGNOSIS — R0602 Shortness of breath: Secondary | ICD-10-CM | POA: Insufficient documentation

## 2017-01-14 LAB — BASIC METABOLIC PANEL
Anion gap: 7 (ref 5–15)
BUN: 8 mg/dL (ref 6–20)
CALCIUM: 8.2 mg/dL — AB (ref 8.9–10.3)
CO2: 22 mmol/L (ref 22–32)
CREATININE: 0.53 mg/dL (ref 0.44–1.00)
Chloride: 107 mmol/L (ref 101–111)
GFR calc non Af Amer: >60 mL/min (ref >60)
GLUCOSE: 103 mg/dL — AB (ref 65–99)
Potassium: 3.5 mmol/L (ref 3.5–5.1)
Sodium: 136 mmol/L (ref 135–145)

## 2017-01-14 LAB — CBC WITH DIFFERENTIAL/PLATELET
BASOS PCT: 0 %
Basophils Absolute: 0 10*3/uL (ref 0.0–0.1)
EOS ABS: 0.4 10*3/uL (ref 0.0–0.7)
EOS PCT: 6 %
HCT: 34.1 % — ABNORMAL LOW (ref 36.0–46.0)
Hemoglobin: 11.6 g/dL — ABNORMAL LOW (ref 12.0–15.0)
Lymphocytes Relative: 28 %
Lymphs Abs: 2 10*3/uL (ref 0.7–4.0)
MCH: 30 pg (ref 26.0–34.0)
MCHC: 34 g/dL (ref 30.0–36.0)
MCV: 88.1 fL (ref 78.0–100.0)
MONOS PCT: 7 %
Monocytes Absolute: 0.5 10*3/uL (ref 0.1–1.0)
Neutro Abs: 4.1 10*3/uL (ref 1.7–7.7)
Neutrophils Relative %: 59 %
Platelets: 244 10*3/uL (ref 150–400)
RBC: 3.87 MIL/uL (ref 3.87–5.11)
RDW: 12.2 % (ref 11.5–15.5)
WBC: 7 10*3/uL (ref 4.0–10.5)

## 2017-01-14 NOTE — ED Provider Notes (Addendum)
[facility] EMERGENCY DEPARTMENT Provider Note   CSN: [id number] Arrival date & time: 01/14/17  0110     History   Chief Complaint Chief Complaint  Patient presents with  . Shortness of Breath    HPI Regina Rojas is a 26 y.o. female.  Patient is a 26 year old female with history of anxiety presenting for evaluation of shortness of breath.  She reports 2 episodes this week where she has developed difficulty breathing, dizziness, heart racing, each lasting for nearly an hour.  Most recently, this occurred this evening while she was ordering food.  She denies any chest pain, fevers, chills, or productive cough.   The history is provided by the patient.  Shortness of Breath  This is a new problem. The average episode lasts 1 hour. The problem occurs intermittently.The current episode started 1 to 2 hours ago. The problem has been resolved. Pertinent negatives include no fever, no sputum production and no chest pain. She has tried nothing for the symptoms.    Past Medical History:  Diagnosis Date  . Anemia   . Bipolar disorder (HCC)   . Cervical dysplasia   . Depression   . Sickle cell trait [facility](HCC)     Patient Active Problem List   Diagnosis Date Noted  . Sickle cell trait (HCC)   . Depression   . Bipolar disorder (HCC)   . Anemia   . Cervical dysplasia     Past Surgical History:  Procedure Laterality Date  . COLPOSCOPY    . GYNECOLOGIC CRYOSURGERY      OB History    Gravida Para Term Preterm AB Living   2       2 0   SAB TAB Ectopic Multiple Live Births   2               Home Medications    Prior to Admission medications   Medication Sig Start Date End Date Taking? Authorizing Provider  naproxen (NAPROSYN) 375 MG tablet Take 1 tablet (375 mg total) by mouth 2 (two) times daily. 06/25/15   Arthor CaptainHarris, Abigail, PA-C  traMADol (ULTRAM) 50 MG tablet Take 1 tablet (50 mg total) by mouth every 6 (six) hours as needed. 06/25/15   Arthor CaptainHarris, Abigail, PA-C     Family History Family History  Problem Relation Age of Onset  . Diabetes Mother   . Hypertension Mother   . Hypertension Sister   . Heart disease Maternal Grandmother     Social History Social History   Tobacco Use  . Smoking status: Current Every Day Smoker    Packs/day: 1.00    Types: Cigarettes  . Smokeless tobacco: Never Used  Substance Use Topics  . Alcohol use: Yes    Comment: occ  . Drug use: Yes    Types: Marijuana     Allergies   Penicillins   Review of Systems Review of Systems  Constitutional: Negative for fever.  Respiratory: Positive for shortness of breath. Negative for sputum production.   Cardiovascular: Negative for chest pain.  All other systems reviewed and are negative.    Physical Exam Updated Vital Signs BP 140/88 (BP Location: Right Arm)   Pulse 71   Temp 97.9 F (36.6 C) (Oral)   Resp 18   Ht 5\' 4"  (1.626 m)   Wt 81.6 kg (180 lb)   LMP 01/12/2017   SpO2 100%   BMI 30.90 kg/m   Physical Exam  Constitutional: She is oriented to person, place, and time.  She appears well-developed and well-nourished. No distress.  HENT:  Head: Normocephalic and atraumatic.  Neck: Normal range of motion. Neck supple.  Cardiovascular: Normal rate and regular rhythm. Exam reveals no gallop and no friction rub.  No murmur heard. Pulmonary/Chest: Effort normal and breath sounds normal. No respiratory distress. She has no wheezes.  Abdominal: Soft. Bowel sounds are normal. She exhibits no distension. There is no tenderness.  Musculoskeletal: Normal range of motion.  Neurological: She is alert and oriented to person, place, and time.  Skin: Skin is warm and dry. She is not diaphoretic.  Nursing note and vitals reviewed.    ED Treatments / Results  Labs (all labs ordered are listed, but only abnormal results are displayed) Labs Reviewed  BASIC METABOLIC PANEL  CBC WITH DIFFERENTIAL/PLATELET    EKG  EKG  Interpretation  Date/Time:  Saturday January 14 2017 02:41:00 EST Ventricular Rate:  64 PR Interval:    QRS Duration: 95 QT Interval:  415 QTC Calculation: 429 R Axis:   33 Text Interpretation:  Sinus rhythm RSR' in V1 or V2, right VCD or RVH Confirmed by Geoffery LyonseLo, Ramiro Pangilinan ([id number]) on 01/14/2017 4:26:34 AM       Radiology Dg Chest 2 View  Result Date: 01/14/2017 CLINICAL DATA:  Acute onset of shortness of breath and dry cough. Generalized chest discomfort. EXAM: CHEST  2 VIEW COMPARISON:  Chest radiograph performed 04/08/2005 FINDINGS: The lungs are well-aerated and clear. There is no evidence of focal opacification, pleural effusion or pneumothorax. The heart is borderline normal in size. No acute osseous abnormalities are seen. IMPRESSION: No acute cardiopulmonary process seen. Electronically Signed   By: Roanna RaiderJeffery  Chang M.D.   On: 01/14/2017 02:13    Procedures Procedures (including critical care time)  Medications Ordered in ED Medications - No data to display   Initial Impression / Assessment and Plan / ED Course  I have reviewed the triage vital signs and the nursing notes.  Pertinent labs & imaging results that were available during my care of the patient were reviewed by me and considered in my medical decision making (see chart for details).  Patient presents with complaints of episodic shortness of breath that may be related to anxiety.  I have found nothing in the workup that would give a more serious explanation.  She has a mild anemia, however laboratory studies are otherwise reassuring.  She will be discharged, to return as needed for any problems.  Final Clinical Impressions(s) / ED Diagnoses   Final diagnoses:  None    ED Discharge Orders    None       Geoffery Lyonselo, Wendolyn Raso, MD 01/14/17 Claria [id number]    Champion Corales, MD 01/14/17 [phone]

## 2017-01-14 NOTE — Discharge Instructions (Signed)
Follow-up with your primary doctor if symptoms are not improving in the next week, and return to the ER if symptoms significantly worsen or change. °

## 2017-01-14 NOTE — ED Notes (Signed)
Pt has been on monitor since EKG completed.  There have not been any irregularities or ectopies throughout stay.  Pt verbalizes understanding of dc instructions and denies any further needs at this time

## 2017-01-14 NOTE — ED Triage Notes (Addendum)
States developed shortness of breath pta  And heart skips a beat when she breathes  Denies pain, increased cough  States wheezing in her cough

## 2017-03-04 ENCOUNTER — Encounter (HOSPITAL_BASED_OUTPATIENT_CLINIC_OR_DEPARTMENT_OTHER): Payer: Self-pay | Admitting: Emergency Medicine

## 2017-03-04 ENCOUNTER — Other Ambulatory Visit: Payer: Self-pay

## 2017-03-04 ENCOUNTER — Emergency Department (HOSPITAL_BASED_OUTPATIENT_CLINIC_OR_DEPARTMENT_OTHER)
Admission: EM | Admit: 2017-03-04 | Discharge: 2017-03-04 | Disposition: A | Discharge status: Home / Self Care | Payer: Self-pay | Attending: Emergency Medicine | Admitting: Emergency Medicine

## 2017-03-04 DIAGNOSIS — J020 Streptococcal pharyngitis: Secondary | ICD-10-CM | POA: Insufficient documentation

## 2017-03-04 DIAGNOSIS — Z79899 Other long term (current) drug therapy: Secondary | ICD-10-CM | POA: Insufficient documentation

## 2017-03-04 DIAGNOSIS — F17210 Nicotine dependence, cigarettes, uncomplicated: Secondary | ICD-10-CM | POA: Insufficient documentation

## 2017-03-04 LAB — RAPID STREP SCREEN (MED CTR MEBANE ONLY): Streptococcus, Group A Screen (Direct): POSITIVE — AB

## 2017-03-04 MED ORDER — AZITHROMYCIN 250 MG PO TABS: 250.0000 mg | ORAL_TABLET | Freq: Every day | ORAL | 0 refills | Status: DC

## 2017-03-04 MED ORDER — AZITHROMYCIN 250 MG PO TABS
500.0000 mg | ORAL_TABLET | Freq: Once | ORAL | Status: AC
  Administered 2017-03-04: 500 mg via ORAL
  Filled 2017-03-04: qty 2

## 2017-03-04 MED ORDER — DEXAMETHASONE 6 MG PO TABS
10.0000 mg | ORAL_TABLET | Freq: Once | ORAL | Status: AC
  Administered 2017-03-04: 10 mg via ORAL
  Filled 2017-03-04: qty 1

## 2017-03-04 MED ORDER — HYDROCODONE-ACETAMINOPHEN 10-300 MG/15ML PO SOLN: 10.0000 mL | Freq: Four times a day (QID) | ORAL | 0 refills | Status: DC | PRN

## 2017-03-04 NOTE — ED Triage Notes (Signed)
Patient states that she has had a sore throat since wed

## 2017-03-04 NOTE — ED Notes (Signed)
Sore throat x 2 days. Also c/o H/A.

## 2017-03-04 NOTE — ED Provider Notes (Signed)
[facility] EMERGENCY DEPARTMENT Provider Note   CSN: [id number] Arrival date & time: 03/04/17  1600     History   Chief Complaint Chief Complaint  Patient presents with  . Sore Throat    HPI Regina Rojas is a 27 y.o. female.  The history is provided by the patient.  Sore Throat  This is a new problem. The current episode started 2 days ago. The problem occurs constantly. The problem has been gradually worsening. Associated symptoms include headaches. Pertinent negatives include no chest pain, no abdominal pain and no shortness of breath. Associated symptoms comments: No cough but is having fever.  Trouble swallowing due to pain. Nothing relieves the symptoms. Treatments tried: otc meds. The treatment provided no relief.    Past Medical History:  Diagnosis Date  . Anemia   . Bipolar disorder (HCC)   . Cervical dysplasia   . Depression   . Sickle cell trait [facility](HCC)     Patient Active Problem List   Diagnosis Date Noted  . Sickle cell trait (HCC)   . Depression   . Bipolar disorder (HCC)   . Anemia   . Cervical dysplasia     Past Surgical History:  Procedure Laterality Date  . COLPOSCOPY    . GYNECOLOGIC CRYOSURGERY      OB History    Gravida Para Term Preterm AB Living   2       2 0   SAB TAB Ectopic Multiple Live Births   2               Home Medications    Prior to Admission medications   Medication Sig Start Date End Date Taking? Authorizing Provider  naproxen (NAPROSYN) 375 MG tablet Take 1 tablet (375 mg total) by mouth 2 (two) times daily. 06/25/15   Arthor CaptainHarris, Abigail, PA-C  traMADol (ULTRAM) 50 MG tablet Take 1 tablet (50 mg total) by mouth every 6 (six) hours as needed. 06/25/15   Arthor CaptainHarris, Abigail, PA-C    Family History Family History  Problem Relation Age of Onset  . Diabetes Mother   . Hypertension Mother   . Hypertension Sister   . Heart disease Maternal Grandmother     Social History Social History   Tobacco Use  .  Smoking status: Current Every Day Smoker    Packs/day: 1.00    Types: Cigarettes  . Smokeless tobacco: Never Used  Substance Use Topics  . Alcohol use: Yes    Comment: occ  . Drug use: Yes    Types: Marijuana     Allergies   Penicillins   Review of Systems Review of Systems  Respiratory: Negative for shortness of breath.   Cardiovascular: Negative for chest pain.  Gastrointestinal: Negative for abdominal pain.  Neurological: Positive for headaches.  All other systems reviewed and are negative.    Physical Exam Updated Vital Signs BP 123/77 (BP Location: Right Arm)   Pulse 86   Temp 98.4 F (36.9 C) (Oral)   Resp 18   Ht 5\' 4"  (1.626 m)   Wt 81.6 kg (180 lb)   LMP 02/14/2017   SpO2 98%   BMI 30.90 kg/m   Physical Exam  Constitutional: She is oriented to person, place, and time. She appears well-developed and well-nourished. No distress.  HENT:  Head: Normocephalic and atraumatic.  Mouth/Throat: Oropharyngeal exudate, posterior oropharyngeal edema and posterior oropharyngeal erythema present. No tonsillar abscesses.  Mild hot potato voice.  No peritonsillar swelling  Eyes: Conjunctivae and  EOM are normal. Pupils are equal, round, and reactive to light.  Neck: Normal range of motion. Neck supple.  Cardiovascular: Normal rate, regular rhythm and intact distal pulses.  No murmur heard. Pulmonary/Chest: Effort normal and breath sounds normal. No respiratory distress. She has no wheezes. She has no rales.  Musculoskeletal: Normal range of motion. She exhibits no edema or tenderness.  Lymphadenopathy:    She has cervical adenopathy.  Neurological: She is alert and oriented to person, place, and time.  Skin: Skin is warm and dry. No rash noted. No erythema.  Psychiatric: She has a normal mood and affect. Her behavior is normal.  Nursing note and vitals reviewed.    ED Treatments / Results  Labs (all labs ordered are listed, but only abnormal results are  displayed) Labs Reviewed  RAPID STREP SCREEN (NOT AT [facility]) - Abnormal; Notable for the following components:      Result Value   Streptococcus, Group A Screen (Direct) POSITIVE (*)    All other components within normal limits    EKG  EKG Interpretation None       Radiology No results found.  Procedures Procedures (including critical care time)  Medications Ordered in ED Medications  dexamethasone (DECADRON) tablet 10 mg (not administered)  azithromycin (ZITHROMAX) tablet 500 mg (not administered)     Initial Impression / Assessment and Plan / ED Course  I have reviewed the triage vital signs and the nursing notes.  Pertinent labs & imaging results that were available during my care of the patient were reviewed by me and considered in my medical decision making (see chart for details).     Patient presenting today with symptoms consistent with strep throat.  She has exudative tonsils that are swollen and erythematous.  She has cervical adenopathy, fever at home and positive contact with strep prior to symptoms.  Patient has a penicillin allergy so will be treated with azithromycin.  She was given Decadron here.  Final Clinical Impressions(s) / ED Diagnoses   Final diagnoses:  Strep pharyngitis    ED Discharge Orders        Ordered    azithromycin (ZITHROMAX) 250 MG tablet  Daily     03/04/17 1830    Hydrocodone-Acetaminophen (LORTAB) 10-300 MG/15ML SOLN  Every 6 hours PRN     03/04/17 1830       Gwyneth Sprout, MD 03/04/17 1831

## 2017-03-04 NOTE — ED Notes (Signed)
Given  popsicle

## 2017-08-25 ENCOUNTER — Emergency Department (HOSPITAL_BASED_OUTPATIENT_CLINIC_OR_DEPARTMENT_OTHER)
Admission: EM | Admit: 2017-08-25 | Discharge: 2017-08-25 | Disposition: A | Discharge status: Home / Self Care | Payer: Self-pay | Attending: Emergency Medicine | Admitting: Emergency Medicine

## 2017-08-25 ENCOUNTER — Other Ambulatory Visit: Payer: Self-pay

## 2017-08-25 ENCOUNTER — Encounter (HOSPITAL_BASED_OUTPATIENT_CLINIC_OR_DEPARTMENT_OTHER): Payer: Self-pay | Admitting: *Deleted

## 2017-08-25 DIAGNOSIS — F17210 Nicotine dependence, cigarettes, uncomplicated: Secondary | ICD-10-CM | POA: Insufficient documentation

## 2017-08-25 DIAGNOSIS — K047 Periapical abscess without sinus: Secondary | ICD-10-CM | POA: Insufficient documentation

## 2017-08-25 MED ORDER — AMOXICILLIN 500 MG PO CAPS: 500.0000 mg | ORAL_CAPSULE | Freq: Three times a day (TID) | ORAL | 0 refills | Status: DC

## 2017-08-25 MED ORDER — HYDROCODONE-ACETAMINOPHEN 5-325 MG PO TABS
1.0000 | ORAL_TABLET | Freq: Once | ORAL | Status: AC
  Administered 2017-08-25: 1 via ORAL
  Filled 2017-08-25: qty 1

## 2017-08-25 MED ORDER — HYDROCODONE-ACETAMINOPHEN 5-325 MG PO TABS: 1.0000 | ORAL_TABLET | ORAL | 0 refills | Status: DC | PRN

## 2017-08-25 MED ORDER — IBUPROFEN 400 MG PO TABS
400.0000 mg | ORAL_TABLET | Freq: Once | ORAL | Status: AC
  Administered 2017-08-25: 400 mg via ORAL
  Filled 2017-08-25: qty 1

## 2017-08-25 MED FILL — HYDROCODON-APAP 5-325: 5-325 | 1 days supply | Qty: 6 | Fill #0

## 2017-08-25 MED FILL — AMOXICILLIN 500 MG CAPSULE: 500 | 7 days supply | Qty: 21 | Fill #0

## 2017-08-25 NOTE — ED Triage Notes (Signed)
Toothache since last night

## 2017-08-25 NOTE — ED Provider Notes (Signed)
[facility] EMERGENCY DEPARTMENT Provider Note   CSN: [id number] Arrival date & time: 08/25/17  1327     History   Chief Complaint Chief Complaint  Patient presents with  . Dental Pain    HPI Regina Rojas is a 27 y.o. female.  Patient is a 27 year old female with a history of sickle cell trait, depression, bipolar disease and anemia who is presenting today with dental pain.  She states the pain started yesterday and has progressively worsened.  She has not seen a dentist because she does not have insurance but she states the pain is excruciating.  She denies any significant facial swelling, difficulty swallowing or systemic symptoms.  The history is provided by the patient.  Dental Pain   This is a recurrent problem. The current episode started yesterday. The problem occurs constantly. The problem has been gradually worsening. The pain is at a severity of 8/10. The pain is severe. She has tried nothing for the symptoms. The treatment provided no relief.    Past Medical History:  Diagnosis Date  . Anemia   . Bipolar disorder (HCC)   . Cervical dysplasia   . Depression   . Sickle cell trait [facility](HCC)     Patient Active Problem List   Diagnosis Date Noted  . Sickle cell trait (HCC)   . Depression   . Bipolar disorder (HCC)   . Anemia   . Cervical dysplasia     Past Surgical History:  Procedure Laterality Date  . COLPOSCOPY    . GYNECOLOGIC CRYOSURGERY       OB History    Gravida  2   Para      Term      Preterm      AB  2   Living  0     SAB  2   TAB      Ectopic      Multiple      Live Births               Home Medications    Prior to Admission medications   Medication Sig Start Date End Date Taking? Authorizing Provider  amoxicillin (AMOXIL) 500 MG capsule Take 1 capsule (500 mg total) by mouth 3 (three) times daily. 08/25/17   Gwyneth SproutPlunkett, Antonia Culbertson, MD  azithromycin (ZITHROMAX) 250 MG tablet Take 1 tablet (250 mg total) by mouth  daily. Take 1 every day until finished. 03/04/17   Gwyneth SproutPlunkett, Anaia Frith, MD  HYDROcodone-acetaminophen (NORCO/VICODIN) 5-325 MG tablet Take 1-2 tablets by mouth every 4 (four) hours as needed for severe pain. 08/25/17   Gwyneth SproutPlunkett, Cheynne Virden, MD  naproxen (NAPROSYN) 375 MG tablet Take 1 tablet (375 mg total) by mouth 2 (two) times daily. 06/25/15   Arthor CaptainHarris, Abigail, PA-C  traMADol (ULTRAM) 50 MG tablet Take 1 tablet (50 mg total) by mouth every 6 (six) hours as needed. 06/25/15   Arthor CaptainHarris, Abigail, PA-C    Family History Family History  Problem Relation Age of Onset  . Diabetes Mother   . Hypertension Mother   . Hypertension Sister   . Heart disease Maternal Grandmother     Social History Social History   Tobacco Use  . Smoking status: Current Every Day Smoker    Packs/day: 1.00    Types: Cigarettes  . Smokeless tobacco: Never Used  Substance Use Topics  . Alcohol use: Yes    Comment: occ  . Drug use: Yes    Types: Marijuana     Allergies  Penicillins   Review of Systems Review of Systems  All other systems reviewed and are negative.    Physical Exam Updated Vital Signs BP (!) 122/91 (BP Location: Right Arm)   Pulse 67   Temp 98.2 F (36.8 C) (Oral)   Resp 16   Ht 5' (1.524 m)   Wt 81.6 kg   SpO2 100%   BMI 35.15 kg/m   Physical Exam  Constitutional: She is oriented to person, place, and time. She appears well-developed and well-nourished. No distress.  HENT:  Head: Normocephalic and atraumatic.  Mouth/Throat: Mucous membranes are normal. Dental caries present.    No facial swelling  Neck:  No throat swelling or swelling under the tongue  Cardiovascular: Normal rate.  Pulmonary/Chest: Effort normal.  Lymphadenopathy:    She has no cervical adenopathy.  Neurological: She is alert and oriented to person, place, and time.  Skin: Skin is warm and dry.  Psychiatric: She has a normal mood and affect. Her behavior is normal.     ED Treatments / Results  Labs (all  labs ordered are listed, but only abnormal results are displayed) Labs Reviewed - No data to display  EKG None  Radiology No results found.  Procedures Procedures (including critical care time)  Medications Ordered in ED Medications  ibuprofen (ADVIL,MOTRIN) tablet 400 mg (400 mg Oral Given 08/25/17 1340)  HYDROcodone-acetaminophen (NORCO/VICODIN) 5-325 MG per tablet 1 tablet (1 tablet Oral Given 08/25/17 1536)     Initial Impression / Assessment and Plan / ED Course  I have reviewed the triage vital signs and the nursing notes.  Pertinent labs & imaging results that were available during my care of the patient were reviewed by me and considered in my medical decision making (see chart for details).    Pt with dental caries and facial swelling.  No signs of ludwig's angina or difficulty swallowing and no systemic symptoms. Will treat with amoxicillin and have pt f/u with dentist.  Final Clinical Impressions(s) / ED Diagnoses   Final diagnoses:  Dental abscess    ED Discharge Orders         Ordered    amoxicillin (AMOXIL) 500 MG capsule  3 times daily     08/25/17 1525    HYDROcodone-acetaminophen (NORCO/VICODIN) 5-325 MG tablet  Every 4 hours PRN     08/25/17 1527           Gwyneth Sprout, MD 08/25/17 2337

## 2017-08-25 NOTE — ED Notes (Signed)
ED Provider at bedside. 

## 2017-10-19 ENCOUNTER — Encounter (HOSPITAL_BASED_OUTPATIENT_CLINIC_OR_DEPARTMENT_OTHER): Payer: Self-pay | Admitting: *Deleted

## 2017-10-19 ENCOUNTER — Emergency Department (HOSPITAL_BASED_OUTPATIENT_CLINIC_OR_DEPARTMENT_OTHER): Payer: Self-pay

## 2017-10-19 ENCOUNTER — Other Ambulatory Visit: Payer: Self-pay

## 2017-10-19 DIAGNOSIS — R079 Chest pain, unspecified: Secondary | ICD-10-CM | POA: Insufficient documentation

## 2017-10-19 DIAGNOSIS — Z79899 Other long term (current) drug therapy: Secondary | ICD-10-CM | POA: Insufficient documentation

## 2017-10-19 DIAGNOSIS — R0602 Shortness of breath: Secondary | ICD-10-CM | POA: Insufficient documentation

## 2017-10-19 DIAGNOSIS — F17210 Nicotine dependence, cigarettes, uncomplicated: Secondary | ICD-10-CM | POA: Insufficient documentation

## 2017-10-19 LAB — BASIC METABOLIC PANEL
ANION GAP: 7 (ref 5–15)
BUN: 6 mg/dL (ref 6–20)
CALCIUM: 8.7 mg/dL — AB (ref 8.9–10.3)
CHLORIDE: 106 mmol/L (ref 98–111)
CO2: 24 mmol/L (ref 22–32)
Creatinine, Ser: 0.75 mg/dL (ref 0.44–1.00)
GFR calc non Af Amer: >60 mL/min (ref >60)
Glucose, Bld: 105 mg/dL — ABNORMAL HIGH (ref 70–99)
Potassium: 3.9 mmol/L (ref 3.5–5.1)
Sodium: 137 mmol/L (ref 135–145)

## 2017-10-19 LAB — CBC
HCT: 41.5 % (ref 36.0–46.0)
HEMOGLOBIN: 14.4 g/dL (ref 12.0–15.0)
MCH: 29.9 pg (ref 26.0–34.0)
MCHC: 34.7 g/dL (ref 30.0–36.0)
MCV: 86.1 fL (ref 78.0–100.0)
Platelets: 306 10*3/uL (ref 150–400)
RBC: 4.82 MIL/uL (ref 3.87–5.11)
RDW: 12.9 % (ref 11.5–15.5)
WBC: 8.6 10*3/uL (ref 4.0–10.5)

## 2017-10-19 LAB — PREGNANCY, URINE: Preg Test, Ur: NEGATIVE

## 2017-10-19 LAB — TROPONIN I: Troponin I: <0.03 ng/mL (ref <0.03)

## 2017-10-19 NOTE — ED Triage Notes (Signed)
2 days of SOB. Tightness in her chest with inspiration.

## 2017-10-20 ENCOUNTER — Emergency Department (HOSPITAL_BASED_OUTPATIENT_CLINIC_OR_DEPARTMENT_OTHER)
Admission: EM | Admit: 2017-10-20 | Discharge: 2017-10-20 | Disposition: A | Discharge status: Home / Self Care | Payer: Self-pay | Attending: Emergency Medicine | Admitting: Emergency Medicine

## 2017-10-20 DIAGNOSIS — R0602 Shortness of breath: Secondary | ICD-10-CM

## 2017-10-20 DIAGNOSIS — R079 Chest pain, unspecified: Secondary | ICD-10-CM

## 2017-10-20 MED ORDER — PREDNISONE 20 MG PO TABS: 60.0000 mg | ORAL_TABLET | Freq: Every day | ORAL | 0 refills | Status: DC

## 2017-10-20 MED ORDER — ALBUTEROL SULFATE HFA 108 (90 BASE) MCG/ACT IN AERS
2.0000 | INHALATION_SPRAY | RESPIRATORY_TRACT | Status: DC | PRN
  Administered 2017-10-20: 2 via RESPIRATORY_TRACT
  Filled 2017-10-20: qty 6.7

## 2017-10-20 MED ORDER — PREDNISONE 50 MG PO TABS
60.0000 mg | ORAL_TABLET | Freq: Once | ORAL | Status: AC
  Administered 2017-10-20: 04:00:00 60 mg via ORAL
  Filled 2017-10-20: qty 1

## 2017-10-20 MED ORDER — IPRATROPIUM-ALBUTEROL 0.5-2.5 (3) MG/3ML IN SOLN
3.0000 mL | Freq: Once | RESPIRATORY_TRACT | Status: AC
Start: 2017-10-20 — End: 2017-10-20
  Administered 2017-10-20: 3 mL via RESPIRATORY_TRACT
  Filled 2017-10-20: qty 3

## 2017-10-20 NOTE — ED Provider Notes (Signed)
[facility] EMERGENCY DEPARTMENT Provider Note   CSN: [id number] Arrival date & time: 10/19/17  2137     History   Chief Complaint Chief Complaint  Patient presents with  . Chest Pain    HPI Regina Rojas is a 27 y.o. female.  The history is provided by the patient.  She has history of bipolar disorder, sickle cell trait and comes in with onset yesterday of episodic chest pain and dyspnea.  Episodes will last about .  When present, the pain is worse with a deep breath.  She describes a sharp pain which he rates at 5/10.  There is no associated cough.  She has had nausea, vomiting, diaphoresis.  There is no exertional component.  She did have a similar episode once before which was felt to be secondary to anxiety.  Symptoms seem to be worse when she smokes.  Nothing makes it better.  She denies history of hypertension, diabetes, hyperlipidemia.  There is no history of premature coronary atherosclerosis in first-degree relatives.  She denies history of DVT, pulmonary embolism, cancer, recent travel, recent surgery, exogenous estrogen use.  Past Medical History:  Diagnosis Date  . Anemia   . Bipolar disorder (HCC)   . Cervical dysplasia   . Depression   . Sickle cell trait [facility])     Patient Active Problem List   Diagnosis Date Noted  . Sickle cell trait (HCC)   . Depression   . Bipolar disorder (HCC)   . Anemia   . Cervical dysplasia     Past Surgical History:  Procedure Laterality Date  . COLPOSCOPY    . GYNECOLOGIC CRYOSURGERY       OB History    Gravida  2   Para      Term      Preterm      AB  2   Living  0     SAB  2   TAB      Ectopic      Multiple      Live Births               Home Medications    Prior to Admission medications   Medication Sig Start Date End Date Taking? Authorizing Provider  amoxicillin (AMOXIL) 500 MG capsule Take 1 capsule (500 mg total) by mouth 3 (three) times daily. 08/25/17   Gwyneth Sprout, MD  azithromycin (ZITHROMAX) 250 MG tablet Take 1 tablet (250 mg total) by mouth daily. Take 1 every day until finished. 03/04/17   Gwyneth Sprout, MD  HYDROcodone-acetaminophen (NORCO/VICODIN) 5-325 MG tablet Take 1-2 tablets by mouth every 4 (four) hours as needed for severe pain. 08/25/17   Gwyneth Sprout, MD  naproxen (NAPROSYN) 375 MG tablet Take 1 tablet (375 mg total) by mouth 2 (two) times daily. 06/25/15   Arthor Captain, PA-C  traMADol (ULTRAM) 50 MG tablet Take 1 tablet (50 mg total) by mouth every 6 (six) hours as needed. 06/25/15   Arthor Captain, PA-C    Family History Family History  Problem Relation Age of Onset  . Diabetes Mother   . Hypertension Mother   . Hypertension Sister   . Heart disease Maternal Grandmother     Social History Social History   Tobacco Use  . Smoking status: Current Every Day Smoker    Packs/day: 1.00    Types: Cigarettes  . Smokeless tobacco: Never Used  Substance Use Topics  . Alcohol use: Yes    Comment: occ  .  Drug use: Yes    Types: Marijuana     Allergies   Penicillins   Review of Systems Review of Systems  All other systems reviewed and are negative.    Physical Exam Updated Vital Signs BP 108/73   Pulse (!) 49   Temp 98.5 F (36.9 C) (Oral)   Resp 17   Ht 5\' 2"  (1.575 m)   Wt 82.6 kg   LMP 09/19/2017 (Approximate)   SpO2 100%   BMI 33.29 kg/m   Physical Exam  Nursing note and vitals reviewed.  27 year old female, resting comfortably and in no acute distress. Vital signs are significant for slow heart rate. Oxygen saturation is 100%, which is normal. Head is normocephalic and atraumatic. PERRLA, EOMI. Oropharynx is clear. Neck is nontender and supple without adenopathy or JVD. Back is nontender and there is no CVA tenderness. Lungs are clear without rales, wheezes, or rhonchi.  There is a slightly prolonged exhalation phase. Chest has moderate tenderness rather diffusely through the anterior chest  wall.  There is no crepitus. Heart has regular rate and rhythm without murmur. Abdomen is soft, flat, nontender without masses or hepatosplenomegaly and peristalsis is normoactive. Extremities have no cyanosis or edema, full range of motion is present. Skin is warm and dry without rash. Neurologic: Mental status is normal, cranial nerves are intact, there are no motor or sensory deficits.  ED Treatments / Results  Labs (all labs ordered are listed, but only abnormal results are displayed) Labs Reviewed  BASIC METABOLIC PANEL - Abnormal; Notable for the following components:      Result Value   Glucose, Bld 105 (*)    Calcium 8.7 (*)    All other components within normal limits  CBC  TROPONIN I  PREGNANCY, URINE    EKG EKG Interpretation  Date/Time:  Thursday October 19 2017 22:04:53 EDT Ventricular Rate:  63 PR Interval:  154 QRS Duration: 82 QT Interval:  408 QTC Calculation: 417 R Axis:   68 Text Interpretation:  Normal sinus rhythm with sinus arrhythmia Normal ECG When compared with ECG of 01/14/2017, No significant change was found Confirmed by Dione Booze ([id number]) on 10/20/2017 2:37:00 AM   Radiology Dg Chest 2 View  Result Date: 10/19/2017 CLINICAL DATA:  Chest pain EXAM: CHEST - 2 VIEW COMPARISON:  01/14/2017 FINDINGS: The heart size and mediastinal contours are within normal limits. Both lungs are clear. The visualized skeletal structures are unremarkable. IMPRESSION: No active cardiopulmonary disease. Electronically Signed   By: Judie Petit.  Shick M.D.   On: 10/19/2017 22:42    Procedures Procedures   Medications Ordered in ED Medications  albuterol (PROVENTIL HFA;VENTOLIN HFA) 108 (90 Base) MCG/ACT inhaler 2 puff (has no administration in time range)  predniSONE (DELTASONE) tablet 60 mg (has no administration in time range)  ipratropium-albuterol (DUONEB) 0.5-2.5 (3) MG/3ML nebulizer solution 3 mL (3 mLs Nebulization Given 10/20/17 0305)     Initial Impression /  Assessment and Plan / ED Course  I have reviewed the triage vital signs and the nursing notes.  Pertinent labs & imaging results that were available during my care of the patient were reviewed by me and considered in my medical decision making (see chart for details).  Chest pain and dyspnea of uncertain cause.  Heart score is 1, which puts her at very low risk of major adverse cardiac events in the next 6 weeks.  ECG is normal, chest x-ray is normal, troponin is normal.  Pulmonary embolism is ruled out  by Anner Crete criteria and PERC criteria, no need for d-dimer testing.  I question whether she may be having episodes of bronchospasm.  Will give therapeutic trial of albuterol with ipratropium via nebulizer.  Old records are reviewed, confirming prior ED visit with similar symptoms last December, diagnosed as anxiety.  3:55 AM She feels much better after above-noted treatment.  She is given a dose of prednisone and discharged with prescription for prednisone.  She is given an albuterol inhaler to take home.  Urged to stop smoking.  Final Clinical Impressions(s) / ED Diagnoses   Final diagnoses:  Nonspecific chest pain  Shortness of breath    ED Discharge Orders         Ordered    predniSONE (DELTASONE) 20 MG tablet  Daily     10/20/17 0354           Dione Booze, MD 10/20/17 [phone]

## 2017-10-20 NOTE — Discharge Instructions (Signed)
Use the inhaler-2 puffs, every 4 hours-as needed for cough or difficulty breathing or chest discomfort.

## 2017-12-02 ENCOUNTER — Emergency Department (HOSPITAL_BASED_OUTPATIENT_CLINIC_OR_DEPARTMENT_OTHER): Payer: Self-pay

## 2017-12-02 ENCOUNTER — Emergency Department (HOSPITAL_BASED_OUTPATIENT_CLINIC_OR_DEPARTMENT_OTHER)
Admission: EM | Admit: 2017-12-02 | Discharge: 2017-12-02 | Disposition: A | Discharge status: Home / Self Care | Payer: Self-pay | Attending: Emergency Medicine | Admitting: Emergency Medicine

## 2017-12-02 ENCOUNTER — Encounter (HOSPITAL_BASED_OUTPATIENT_CLINIC_OR_DEPARTMENT_OTHER): Payer: Self-pay | Admitting: Emergency Medicine

## 2017-12-02 ENCOUNTER — Other Ambulatory Visit: Payer: Self-pay

## 2017-12-02 DIAGNOSIS — F17210 Nicotine dependence, cigarettes, uncomplicated: Secondary | ICD-10-CM | POA: Insufficient documentation

## 2017-12-02 DIAGNOSIS — R0789 Other chest pain: Secondary | ICD-10-CM | POA: Insufficient documentation

## 2017-12-02 DIAGNOSIS — D649 Anemia, unspecified: Secondary | ICD-10-CM | POA: Insufficient documentation

## 2017-12-02 DIAGNOSIS — F319 Bipolar disorder, unspecified: Secondary | ICD-10-CM | POA: Insufficient documentation

## 2017-12-02 DIAGNOSIS — R0602 Shortness of breath: Secondary | ICD-10-CM | POA: Insufficient documentation

## 2017-12-02 DIAGNOSIS — Z79899 Other long term (current) drug therapy: Secondary | ICD-10-CM | POA: Insufficient documentation

## 2017-12-02 LAB — BASIC METABOLIC PANEL
ANION GAP: 7 (ref 5–15)
BUN: 9 mg/dL (ref 6–20)
CHLORIDE: 104 mmol/L (ref 98–111)
CO2: 25 mmol/L (ref 22–32)
Calcium: 8.6 mg/dL — ABNORMAL LOW (ref 8.9–10.3)
Creatinine, Ser: 0.7 mg/dL (ref 0.44–1.00)
GFR calc Af Amer: >60 mL/min (ref >60)
Glucose, Bld: 92 mg/dL (ref 70–99)
POTASSIUM: 3.5 mmol/L (ref 3.5–5.1)
Sodium: 136 mmol/L (ref 135–145)

## 2017-12-02 LAB — HCG, SERUM, QUALITATIVE: Preg, Serum: NEGATIVE

## 2017-12-02 LAB — CBC
HEMATOCRIT: 40 % (ref 36.0–46.0)
HEMOGLOBIN: 13.1 g/dL (ref 12.0–15.0)
MCH: 29.6 pg (ref 26.0–34.0)
MCHC: 32.8 g/dL (ref 30.0–36.0)
MCV: 90.3 fL (ref 80.0–100.0)
Platelets: 304 10*3/uL (ref 150–400)
RBC: 4.43 MIL/uL (ref 3.87–5.11)
RDW: 13 % (ref 11.5–15.5)
WBC: 8.2 10*3/uL (ref 4.0–10.5)
nRBC: 0 % (ref 0.0–0.2)

## 2017-12-02 LAB — TROPONIN I

## 2017-12-02 MED ORDER — PREDNISONE 10 MG PO TABS: 40.0000 mg | ORAL_TABLET | Freq: Every day | ORAL | 0 refills | Status: AC

## 2017-12-02 MED ORDER — HYDROXYZINE HCL 25 MG PO TABS: 25.0000 mg | ORAL_TABLET | Freq: Three times a day (TID) | ORAL | 0 refills | Status: DC | PRN

## 2017-12-02 MED ORDER — DICLOFENAC SODIUM 1 % TD GEL: 2.0000 g | Freq: Four times a day (QID) | TRANSDERMAL | 0 refills | Status: DC

## 2017-12-02 NOTE — ED Notes (Signed)
Pt given Rx x 3 and d/c home with friend/family

## 2017-12-02 NOTE — ED Provider Notes (Signed)
[facility] EMERGENCY DEPARTMENT Provider Note   CSN: [id number] Arrival date & time: 12/02/17  1631     History   Chief Complaint Chief Complaint  Patient presents with  . Chest Pain    HPI Regina Rojas is a 27 y.o. female presenting for evaluation of chest pain shortness of breath.  Patient states over the past several weeks, she has been having episodes where she develops chest pain, shortness of breath.  She states that when she gets these episodes, she feels like her chest is tight and she cannot get a deep breath in.  She states she occasionally has dizziness and bilateral palm and feet sweating during these episodes. Pt states these episodes last for about 15 minutes before resolving without intervention. She has multiple episodes a day.  She denies any other medical problems, states she does not take any medications daily.  She denies a history of anxiety, states both of her parents have anxiety.  She denies new stressors or triggers to cause her to feel anxious.  She denies recent travel, surgeries, immobilization, history of cancer, history of previous DVT/PE, or hormone use.  She reports daily tobacco and marijuana use.  Denies alcohol or other drug use.  She denies a history of problem with her blood pressure blood sugar.  She denies family history of heart problems.  Patient states she was given an albuterol inhaler when she had a similar episode, she has been using this during these episodes without improvement.  She states she is currently without chest pain or shortness of breath.  History obtained from chart review, patient was evaluated early October for similar episodes.  Work-up was reassuring at that time.  She is discharged on albuterol and prednisone.  Additionally, patient has a diagnosis of bipolar and depression in her past medical history, despite her ascertation that she does not have a history of anxiety or depression.   HPI  Past Medical  History:  Diagnosis Date  . Anemia   . Bipolar disorder (HCC)   . Cervical dysplasia   . Depression   . Sickle cell trait [facility])     Patient Active Problem List   Diagnosis Date Noted  . Sickle cell trait (HCC)   . Depression   . Bipolar disorder (HCC)   . Anemia   . Cervical dysplasia     Past Surgical History:  Procedure Laterality Date  . COLPOSCOPY    . GYNECOLOGIC CRYOSURGERY       OB History    Gravida  2   Para      Term      Preterm      AB  2   Living  0     SAB  2   TAB      Ectopic      Multiple      Live Births               Home Medications    Prior to Admission medications   Medication Sig Start Date End Date Taking? Authorizing Provider  diclofenac sodium (VOLTAREN) 1 % GEL Apply 2 g topically 4 (four) times daily. 12/02/17   Joshu Furukawa, PA-C  hydrOXYzine (ATARAX/VISTARIL) 25 MG tablet Take 1 tablet (25 mg total) by mouth every 8 (eight) hours as needed for anxiety. 12/02/17   Zaion Hreha, PA-C  naproxen (NAPROSYN) 375 MG tablet Take 1 tablet (375 mg total) by mouth 2 (two) times daily. 06/25/15   Arthor Captain,  PA-C  predniSONE (DELTASONE) 10 MG tablet Take 4 tablets (40 mg total) by mouth daily for 4 days. 12/02/17 12/06/17  Keerat Denicola, PA-C    Family History Family History  Problem Relation Age of Onset  . Diabetes Mother   . Hypertension Mother   . Hypertension Sister   . Heart disease Maternal Grandmother     Social History Social History   Tobacco Use  . Smoking status: Current Every Day Smoker    Packs/day: 1.00    Types: Cigarettes  . Smokeless tobacco: Never Used  Substance Use Topics  . Alcohol use: Yes    Comment: occ  . Drug use: Yes    Types: Marijuana     Allergies   Penicillins   Review of Systems Review of Systems  Respiratory: Positive for chest tightness and shortness of breath.   Cardiovascular: Positive for chest pain.  All other systems reviewed and are  negative.    Physical Exam Updated Vital Signs BP 103/67 (BP Location: Right Arm)   Pulse 64   Temp 98.1 F (36.7 C) (Oral)   Resp 16   Ht 5\' 2"  (1.575 m)   Wt 85.3 kg   SpO2 100%   BMI 34.39 kg/m   Physical Exam  Constitutional: She is oriented to person, place, and time. She appears well-developed and well-nourished. No distress.  Sitting comfortably in the bed in no acute distress  HENT:  Head: Normocephalic and atraumatic.  OP clear without tonsillar swelling or exudate.  Uvula midline with equal palate rise.  Eyes: Pupils are equal, round, and reactive to light. Conjunctivae and EOM are normal.  Neck: Normal range of motion. Neck supple.  Cardiovascular: Normal rate, regular rhythm and intact distal pulses.  Pulmonary/Chest: Effort normal and breath sounds normal. No respiratory distress. She has no wheezes. She exhibits tenderness.  speaking in full sentences.  Clear lung sounds in all fields.  Tenderness palpation of the anterior chest wall, mostly along the borders of the sternum.  Abdominal: Soft. She exhibits no distension and no mass. There is no tenderness. There is no guarding.  Musculoskeletal: Normal range of motion.  No leg pain or swelling  Neurological: She is alert and oriented to person, place, and time. No sensory deficit.  Skin: Skin is warm and dry. Capillary refill takes less than 2 seconds.  Psychiatric: She has a normal mood and affect.  Nursing note and vitals reviewed.    ED Treatments / Results  Labs (all labs ordered are listed, but only abnormal results are displayed) Labs Reviewed  BASIC METABOLIC PANEL - Abnormal; Notable for the following components:      Result Value   Calcium 8.6 (*)    All other components within normal limits  CBC  TROPONIN I  HCG, SERUM, QUALITATIVE    EKG EKG Interpretation  Date/Time:  Saturday December 02 2017 16:39:11 EST Ventricular Rate:  60 PR Interval:  134 QRS Duration: 88 QT Interval:  418 QTC  Calculation: 418 R Axis:   70 Text Interpretation:  Normal sinus rhythm Normal ECG Confirmed by Virgina Norfolk [phone]) on 12/02/2017 5:31:38 PM   Radiology Dg Chest 2 View  Result Date: 12/02/2017 CLINICAL DATA:  Chest pain and pressure and shortness of breath for the past 2 days. Smoker. EXAM: CHEST - 2 VIEW COMPARISON:  10/19/2017. FINDINGS: Normal sized heart. Clear lungs. Stable mildly prominent interstitial markings. Normal appearing bones. IMPRESSION: No acute abnormality. Stable mild chronic interstitial lung disease compatible with the history  of smoking. Electronically Signed   By: Beckie SaltsSteven  Reid M.D.   On: 12/02/2017 17:03    Procedures Procedures (including critical care time)  Medications Ordered in ED Medications - No data to display   Initial Impression / Assessment and Plan / ED Course  I have reviewed the triage vital signs and the nursing notes.  Pertinent labs & imaging results that were available during my care of the patient were reviewed by me and considered in my medical decision making (see chart for details).     Pt presenting for evaluation of episodes of chest pain, shortness of breath, chest tightness, bilateral palm and plantars woodiness, and occasional dizziness.  Physical exam reassuring, she appears nontoxic.  Patient has tenderness palpation of the chest wall along the sternal border, consider costochondritis that she recently had a viral illness.  Additionally, consider asthma versus bronchitis, as patient is describing a chest tightness and has a long smoking history.  Chest x-ray read interpreted by me, no pneumonia, pneumothorax, effusion.  Per radiology, chronic changes consistent with smoking noted on the x-ray.  Low suspicion for ACS, EKG reassuring and troponin negative. Heart score 0. Low suspicion for PE, PERC negative.  Also consider anxiety as cause.  Discussed with patient.  Discussed that at this time, I do not believe her symptoms are due to a  life-threatening or emergent condition requiring hospitalization.  Discussed follow-up with primary care for further evaluation.  Will give Vistaril as needed for anxiety, prednisone for chest tightness and possible inflammation symptoms, and Voltaren gel for CP and possible costochondritis.  At this time, patient appears safe discharge.  Return precautions given.  Patient states she understands and agrees to plan.  Final Clinical Impressions(s) / ED Diagnoses   Final diagnoses:  Atypical chest pain    ED Discharge Orders         Ordered    predniSONE (DELTASONE) 10 MG tablet  Daily     12/02/17 1857    hydrOXYzine (ATARAX/VISTARIL) 25 MG tablet  Every 8 hours PRN     12/02/17 1857    diclofenac sodium (VOLTAREN) 1 % GEL  4 times daily     12/02/17 1857           Shinichi Anguiano, PA-C 12/02/17 2022    Virgina Norfolkuratolo, Adam, DO 12/02/17 2357

## 2017-12-02 NOTE — Discharge Instructions (Signed)
Prednisone as prescribed for the next couple days. Use Voltaren gel to help with pain. Take Vistaril 3 times a day to help with anxiety. It is important that you follow-up with a primary care doctor for further evaluation of your symptoms.  You may follow-up with your primary care doctor that she is seen in the past, or there is also information for [facility]. Return to the emergency room with any new, worsening, concerning symptoms.

## 2017-12-02 NOTE — ED Triage Notes (Signed)
Reports chest pain and shortness of breath since yesterday.  States she feels this is worsening.

## 2017-12-05 MED FILL — hydrOXYzine HCL 25 MG TABS: 25 | 4 days supply | Qty: 12 | Fill #0

## 2018-03-14 ENCOUNTER — Other Ambulatory Visit: Payer: Self-pay

## 2018-03-14 ENCOUNTER — Emergency Department (HOSPITAL_BASED_OUTPATIENT_CLINIC_OR_DEPARTMENT_OTHER)
Admission: EM | Admit: 2018-03-14 | Discharge: 2018-03-14 | Disposition: A | Discharge status: Home / Self Care | Payer: Self-pay | Attending: Emergency Medicine | Admitting: Emergency Medicine

## 2018-03-14 ENCOUNTER — Encounter (HOSPITAL_BASED_OUTPATIENT_CLINIC_OR_DEPARTMENT_OTHER): Payer: Self-pay | Admitting: Emergency Medicine

## 2018-03-14 DIAGNOSIS — J111 Influenza due to unidentified influenza virus with other respiratory manifestations: Secondary | ICD-10-CM | POA: Insufficient documentation

## 2018-03-14 DIAGNOSIS — F17210 Nicotine dependence, cigarettes, uncomplicated: Secondary | ICD-10-CM | POA: Insufficient documentation

## 2018-03-14 MED ORDER — OSELTAMIVIR PHOSPHATE 75 MG PO CAPS
75.0000 mg | ORAL_CAPSULE | Freq: Once | ORAL | Status: AC
  Administered 2018-03-14: 75 mg via ORAL
  Filled 2018-03-14: qty 1

## 2018-03-14 MED ORDER — OSELTAMIVIR PHOSPHATE 75 MG PO CAPS: 75.0000 mg | ORAL_CAPSULE | Freq: Two times a day (BID) | ORAL | 0 refills | Status: DC

## 2018-03-14 MED ORDER — ACETAMINOPHEN 500 MG PO TABS
1000.0000 mg | ORAL_TABLET | Freq: Once | ORAL | Status: AC
  Administered 2018-03-14: 1000 mg via ORAL
  Filled 2018-03-14: qty 2

## 2018-03-14 MED ORDER — IBUPROFEN 800 MG PO TABS: 800.0000 mg | ORAL_TABLET | Freq: Four times a day (QID) | ORAL | 0 refills | Status: DC | PRN

## 2018-03-14 MED ORDER — BENZONATATE 100 MG PO CAPS: 100.0000 mg | ORAL_CAPSULE | Freq: Three times a day (TID) | ORAL | 0 refills | Status: DC | PRN

## 2018-03-14 MED ORDER — KETOROLAC TROMETHAMINE 60 MG/2ML IM SOLN
60.0000 mg | Freq: Once | INTRAMUSCULAR | Status: AC
  Administered 2018-03-14: 60 mg via INTRAMUSCULAR
  Filled 2018-03-14: qty 2

## 2018-03-14 MED FILL — IBUPROFEN 800 MG TABS: 800 | 5 days supply | Qty: 20 | Fill #0

## 2018-03-14 MED FILL — OSELTAMIVIR PHOSPHATE 75 MG: 75 | 5 days supply | Qty: 10 | Fill #0

## 2018-03-14 MED FILL — BENZONATATE 100 MG CAP: 100 | 6 days supply | Qty: 20 | Fill #0

## 2018-03-14 NOTE — ED Provider Notes (Signed)
[facility] EMERGENCY DEPARTMENT Provider Note   CSN: [id number] Arrival date & time: 03/14/18  0148    History   Chief Complaint Chief Complaint  Patient presents with  . Influenza    HPI Regina Rojas is a 28 y.o. female.     Presents with 3-day history of flulike symptoms.  Complaining of headache, nasal congestion, cough, body aches and malaise.  No shortness of breath.  No neck pain or stiffness.  No vomiting or diarrhea.  Multiple family members with similar symptoms.  Boyfriend also here in the ER today with similar symptoms.     Past Medical History:  Diagnosis Date  . Anemia   . Bipolar disorder (HCC)   . Cervical dysplasia   . Depression   . Sickle cell trait [facility])     Patient Active Problem List   Diagnosis Date Noted  . Sickle cell trait (HCC)   . Depression   . Bipolar disorder (HCC)   . Anemia   . Cervical dysplasia     Past Surgical History:  Procedure Laterality Date  . COLPOSCOPY    . GYNECOLOGIC CRYOSURGERY       OB History    Gravida  2   Para      Term      Preterm      AB  2   Living  0     SAB  2   TAB      Ectopic      Multiple      Live Births               Home Medications    Prior to Admission medications   Medication Sig Start Date End Date Taking? Authorizing Provider  benzonatate (TESSALON) 100 MG capsule Take 1 capsule (100 mg total) by mouth 3 (three) times daily as needed for cough. 03/14/18   Gilda Crease, MD  ibuprofen (ADVIL,MOTRIN) 800 MG tablet Take 1 tablet (800 mg total) by mouth every 6 (six) hours as needed for moderate pain. 03/14/18   Gilda Crease, MD  oseltamivir (TAMIFLU) 75 MG capsule Take 1 capsule (75 mg total) by mouth every 12 (twelve) hours. 03/14/18   Gilda Crease, MD    Family History Family History  Problem Relation Age of Onset  . Diabetes Mother   . Hypertension Mother   . Hypertension Sister   . Heart disease Maternal  Grandmother     Social History Social History   Tobacco Use  . Smoking status: Current Every Day Smoker    Packs/day: 1.00    Types: Cigarettes  . Smokeless tobacco: Never Used  Substance Use Topics  . Alcohol use: Yes    Comment: occ  . Drug use: Yes    Types: Marijuana     Allergies   Penicillins   Review of Systems Review of Systems  Constitutional: Positive for chills and fever.  HENT: Positive for congestion.   Respiratory: Positive for cough.   Neurological: Positive for headaches.  All other systems reviewed and are negative.    Physical Exam Updated Vital Signs BP 111/82 (BP Location: Left Arm)   Pulse (!) 101   Temp 98.8 F (37.1 C) (Oral)   Resp 16   Ht 5\' 2"  (1.575 m)   Wt 88 kg   SpO2 97%   BMI 35.48 kg/m   Physical Exam Vitals signs and nursing note reviewed.  Constitutional:      General: She is  not in acute distress.    Appearance: Normal appearance. She is well-developed.  HENT:     Head: Normocephalic and atraumatic.     Right Ear: Hearing normal.     Left Ear: Hearing normal.     Nose: Nose normal.  Eyes:     Conjunctiva/sclera: Conjunctivae normal.     Pupils: Pupils are equal, round, and reactive to light.  Neck:     Musculoskeletal: Normal range of motion and neck supple. No neck rigidity.     Meningeal: Brudzinski's sign and Kernig's sign absent.  Cardiovascular:     Rate and Rhythm: Regular rhythm.     Heart sounds: S1 normal and S2 normal. No murmur. No friction rub. No gallop.   Pulmonary:     Effort: Pulmonary effort is normal. No respiratory distress.     Breath sounds: Normal breath sounds.  Chest:     Chest wall: No tenderness.  Abdominal:     General: Bowel sounds are normal.     Palpations: Abdomen is soft.     Tenderness: There is no abdominal tenderness. There is no guarding or rebound. Negative signs include Murphy's sign and McBurney's sign.     Hernia: No hernia is present.  Musculoskeletal: Normal range of  motion.  Skin:    General: Skin is warm and dry.     Findings: No rash.  Neurological:     Mental Status: She is alert and oriented to person, place, and time.     GCS: GCS eye subscore is 4. GCS verbal subscore is 5. GCS motor subscore is 6.     Cranial Nerves: No cranial nerve deficit.     Sensory: No sensory deficit.     Coordination: Coordination normal.  Psychiatric:        Speech: Speech normal.        Behavior: Behavior normal.        Thought Content: Thought content normal.      ED Treatments / Results  Labs (all labs ordered are listed, but only abnormal results are displayed) Labs Reviewed - No data to display  EKG None  Radiology No results found.  Procedures Procedures (including critical care time)  Medications Ordered in ED Medications  ketorolac (TORADOL) injection 60 mg (has no administration in time range)  oseltamivir (TAMIFLU) capsule 75 mg (has no administration in time range)  acetaminophen (TYLENOL) tablet 1,000 mg (has no administration in time range)     Initial Impression / Assessment and Plan / ED Course  I have reviewed the triage vital signs and the nursing notes.  Pertinent labs & imaging results that were available during my care of the patient were reviewed by me and considered in my medical decision making (see chart for details).        Patient presents with flulike symptoms.  She appears well.  She is complaining of a headache, but not the worst headache of her life.  Normal neurologic exam.  Neck is supple, no signs of meningismus.  Treat empirically for influenza.  Final Clinical Impressions(s) / ED Diagnoses   Final diagnoses:  Influenza    ED Discharge Orders         Ordered    oseltamivir (TAMIFLU) 75 MG capsule  Every 12 hours     03/14/18 0210    benzonatate (TESSALON) 100 MG capsule  3 times daily PRN     03/14/18 0210    ibuprofen (ADVIL,MOTRIN) 800 MG tablet  Every 6 hours PRN  03/14/18 0210             Gilda Crease, MD 03/14/18 [phone]

## 2018-03-14 NOTE — ED Triage Notes (Signed)
Pt c/o flu like symptoms  x 2 days 

## 2018-03-16 ENCOUNTER — Emergency Department (HOSPITAL_BASED_OUTPATIENT_CLINIC_OR_DEPARTMENT_OTHER): Payer: Self-pay

## 2018-03-16 ENCOUNTER — Emergency Department (HOSPITAL_BASED_OUTPATIENT_CLINIC_OR_DEPARTMENT_OTHER)
Admission: EM | Admit: 2018-03-16 | Discharge: 2018-03-16 | Disposition: A | Discharge status: Home / Self Care | Payer: Self-pay | Attending: Emergency Medicine | Admitting: Emergency Medicine

## 2018-03-16 ENCOUNTER — Encounter (HOSPITAL_BASED_OUTPATIENT_CLINIC_OR_DEPARTMENT_OTHER): Payer: Self-pay

## 2018-03-16 ENCOUNTER — Other Ambulatory Visit: Payer: Self-pay

## 2018-03-16 DIAGNOSIS — F17210 Nicotine dependence, cigarettes, uncomplicated: Secondary | ICD-10-CM | POA: Insufficient documentation

## 2018-03-16 DIAGNOSIS — R05 Cough: Secondary | ICD-10-CM

## 2018-03-16 DIAGNOSIS — J111 Influenza due to unidentified influenza virus with other respiratory manifestations: Secondary | ICD-10-CM | POA: Insufficient documentation

## 2018-03-16 DIAGNOSIS — R059 Cough, unspecified: Secondary | ICD-10-CM

## 2018-03-16 NOTE — ED Triage Notes (Signed)
Pt was seen in ED on 2/26 and dx with a flu like illness. Pt was given Tamiflu. Today pt states she has chest congestion and coughing up blood tinged mucous.

## 2018-03-16 NOTE — Discharge Instructions (Addendum)
Stop the Tamiflu. Push hydrating fluids. Tessalon as needed as prescribed for cough. Follow up with your doctor as needed.

## 2018-03-16 NOTE — ED Provider Notes (Signed)
[facility] EMERGENCY DEPARTMENT Provider Note   CSN: [id number] Arrival date & time: 03/16/18  1315    History   Chief Complaint Chief Complaint  Patient presents with  . Influenza    HPI Regina Rojas is a 28 y.o. female.     28 year old female presents with complaint of cough.  Patient reports flulike symptoms for the past week, seen in the ER 2 days ago and told she had the flu based on symptoms and was given Tamiflu.  Patient started her Tamiflu yesterday, states she is not feeling better and noted blood in her sputum today with coughing.  Patient feels like her cold has settled into her chest.  Patient does not have a history of asthma or chronic lung disease, patient is a daily smoker.     Past Medical History:  Diagnosis Date  . Anemia   . Bipolar disorder (HCC)   . Cervical dysplasia   . Depression   . Sickle cell trait [facility])     Patient Active Problem List   Diagnosis Date Noted  . Sickle cell trait (HCC)   . Depression   . Bipolar disorder (HCC)   . Anemia   . Cervical dysplasia     Past Surgical History:  Procedure Laterality Date  . COLPOSCOPY    . GYNECOLOGIC CRYOSURGERY       OB History    Gravida  2   Para      Term      Preterm      AB  2   Living  0     SAB  2   TAB      Ectopic      Multiple      Live Births               Home Medications    Prior to Admission medications   Medication Sig Start Date End Date Taking? Authorizing Provider  benzonatate (TESSALON) 100 MG capsule Take 1 capsule (100 mg total) by mouth 3 (three) times daily as needed for cough. 03/14/18   Gilda Crease, MD  ibuprofen (ADVIL,MOTRIN) 800 MG tablet Take 1 tablet (800 mg total) by mouth every 6 (six) hours as needed for moderate pain. 03/14/18   Gilda Crease, MD  oseltamivir (TAMIFLU) 75 MG capsule Take 1 capsule (75 mg total) by mouth every 12 (twelve) hours. 03/14/18   Gilda Crease, MD    Family  History Family History  Problem Relation Age of Onset  . Diabetes Mother   . Hypertension Mother   . Hypertension Sister   . Heart disease Maternal Grandmother     Social History Social History   Tobacco Use  . Smoking status: Current Every Day Smoker    Packs/day: 1.00    Types: Cigarettes  . Smokeless tobacco: Never Used  Substance Use Topics  . Alcohol use: Yes    Comment: occ  . Drug use: Yes    Types: Marijuana     Allergies   Penicillins   Review of Systems Review of Systems  Constitutional: Negative for chills and fever.  HENT: Positive for congestion. Negative for rhinorrhea, sinus pressure, sinus pain, sneezing and sore throat.   Eyes: Negative for discharge and redness.  Respiratory: Positive for cough. Negative for shortness of breath and wheezing.   Gastrointestinal: Negative for nausea and vomiting.  Musculoskeletal: Negative for back pain and myalgias.  Skin: Negative for rash and wound.  Allergic/Immunologic: Negative for  immunocompromised state.  Neurological: Negative for headaches.  Hematological: Negative for adenopathy.  Psychiatric/Behavioral: Negative for confusion.  All other systems reviewed and are negative.    Physical Exam Updated Vital Signs BP 120/86   Pulse 84   Temp 98.3 F (36.8 C) (Oral)   Resp 20   Ht 5\' 2"  (1.575 m)   Wt 88 kg   LMP 03/01/2018   SpO2 100%   BMI 35.48 kg/m   Physical Exam Vitals signs and nursing note reviewed.  Constitutional:      General: She is not in acute distress.    Appearance: She is well-developed. She is not diaphoretic.  HENT:     Head: Normocephalic and atraumatic.     Nose: Nose normal.     Mouth/Throat:     Mouth: Mucous membranes are moist.     Pharynx: No oropharyngeal exudate or posterior oropharyngeal erythema.  Neck:     Musculoskeletal: Neck supple.  Cardiovascular:     Rate and Rhythm: Normal rate and regular rhythm.     Pulses: Normal pulses.     Heart sounds: Normal  heart sounds.  Pulmonary:     Effort: Pulmonary effort is normal.     Breath sounds: Normal breath sounds.  Lymphadenopathy:     Cervical: No cervical adenopathy.  Skin:    General: Skin is warm and dry.  Neurological:     Mental Status: She is alert and oriented to person, place, and time.  Psychiatric:        Behavior: Behavior normal.      ED Treatments / Results  Labs (all labs ordered are listed, but only abnormal results are displayed) Labs Reviewed - No data to display  EKG None  Radiology Dg Chest 2 View  Result Date: 03/16/2018 CLINICAL DATA:  Shortness of breath with cough EXAM: CHEST - 2 VIEW COMPARISON:  December 02, 2017 FINDINGS: Lungs are clear. The heart size and pulmonary vascularity are normal. No adenopathy. No bone lesions. IMPRESSION: No edema or consolidation. Electronically Signed   By: Bretta Bang III M.D.   On: 03/16/2018 13:44    Procedures Procedures (including critical care time)  Medications Ordered in ED Medications - No data to display   Initial Impression / Assessment and Plan / ED Course  I have reviewed the triage vital signs and the nursing notes.  Pertinent labs & imaging results that were available during my care of the patient were reviewed by me and considered in my medical decision making (see chart for details).  Clinical Course as of Mar 16 1405  Fri Mar 16, 2018  1405 28yo female with flu like illness x 1 week, now with cough with blood tinged sputum. Patient is well appearing, lungs clear, HEENT exam unremarkable, CXR normal. Recommend dc Tamiflu (started yesterday, symptoms x 1 week). Cough medicine as directed/prescribed, push hydrating fluids, see PCP. Given note for work as requested.    [LM]    Clinical Course User Index [LM] Jeannie Fend, PA-C   Final Clinical Impressions(s) / ED Diagnoses   Final diagnoses:  Cough    ED Discharge Orders    None       Jeannie Fend, PA-C 03/16/18 1408      Little, Ambrose Finland, MD 03/16/18 [phone]

## 2018-05-03 ENCOUNTER — Other Ambulatory Visit: Payer: Self-pay

## 2018-05-03 ENCOUNTER — Encounter (HOSPITAL_COMMUNITY): Payer: Self-pay

## 2018-05-03 ENCOUNTER — Emergency Department (HOSPITAL_BASED_OUTPATIENT_CLINIC_OR_DEPARTMENT_OTHER)
Admission: EM | Admit: 2018-05-03 | Discharge: 2018-05-03 | Disposition: A | Discharge status: Home / Self Care | Payer: Self-pay | Attending: Emergency Medicine | Admitting: Emergency Medicine

## 2018-05-03 ENCOUNTER — Encounter (HOSPITAL_BASED_OUTPATIENT_CLINIC_OR_DEPARTMENT_OTHER): Payer: Self-pay | Admitting: Emergency Medicine

## 2018-05-03 ENCOUNTER — Emergency Department (HOSPITAL_COMMUNITY)
Admission: EM | Admit: 2018-05-03 | Discharge: 2018-05-03 | Disposition: A | Discharge status: Home / Self Care | Payer: Self-pay | Attending: Emergency Medicine | Admitting: Emergency Medicine

## 2018-05-03 DIAGNOSIS — F17210 Nicotine dependence, cigarettes, uncomplicated: Secondary | ICD-10-CM | POA: Insufficient documentation

## 2018-05-03 DIAGNOSIS — K0889 Other specified disorders of teeth and supporting structures: Secondary | ICD-10-CM | POA: Insufficient documentation

## 2018-05-03 DIAGNOSIS — F319 Bipolar disorder, unspecified: Secondary | ICD-10-CM | POA: Insufficient documentation

## 2018-05-03 DIAGNOSIS — Z79899 Other long term (current) drug therapy: Secondary | ICD-10-CM | POA: Insufficient documentation

## 2018-05-03 MED ORDER — CLINDAMYCIN HCL 150 MG PO CAPS: 450.0000 mg | ORAL_CAPSULE | Freq: Three times a day (TID) | ORAL | 0 refills | Status: AC

## 2018-05-03 MED ORDER — KETOROLAC TROMETHAMINE 30 MG/ML IJ SOLN
30.0000 mg | Freq: Once | INTRAMUSCULAR | Status: AC
  Administered 2018-05-03: 10:00:00 30 mg via INTRAMUSCULAR
  Filled 2018-05-03: qty 1

## 2018-05-03 MED ORDER — NAPROXEN 500 MG PO TABS: 500.0000 mg | ORAL_TABLET | Freq: Two times a day (BID) | ORAL | 0 refills | Status: DC

## 2018-05-03 MED FILL — NAPROXEN 500 MG TABLET: 500 | 15 days supply | Qty: 30 | Fill #0

## 2018-05-03 MED FILL — CLINDAMYCIN HCL 150 MG CAPS: 150 | 7 days supply | Qty: 63 | Fill #0

## 2018-05-03 NOTE — Discharge Instructions (Signed)
You were given a prescription for antibiotics. Please take the antibiotic prescription fully.   Please follow-up with a dentist in the next 5 to 7 days for reevaluation.  If you do not have a dentist, resources were provided for dentist in the area in your discharge summary.  Please contact one of the offices that are listed and make an appointment for follow-up.  Please return to the emergency department for any new or worsening symptoms.  

## 2018-05-03 NOTE — ED Triage Notes (Addendum)
Reports to ER for left upper dental pain x 2 days.  [facility] States given abx.

## 2018-05-03 NOTE — ED Notes (Signed)
ED Provider at bedside. 

## 2018-05-03 NOTE — Discharge Instructions (Signed)
You have been seen today for dental pain. Please read and follow all provided instructions.   1. Medications: antibiotic as previously prescribed, naproxen for pain (you may take tylenol with this medication, but do not take other anti-inflammatories with this medication), usual home medications 2. Treatment: rest, drink plenty of fluids 3. Follow Up: Please call and follow up with a dentist. Please follow up with your primary doctor in 2-5 days for discussion of your diagnoses and further evaluation after today's visit; if you do not have a primary care doctor use the resource guide provided to find one; Please return to the ER for any new or worsening symptoms. Please obtain all of your results from medical records or have your doctors office obtain the results - share them with your doctor - you should be seen at your doctors office. Call today to arrange your follow up.   Take medications as prescribed. Please review all of the medicines and only take them if you do not have an allergy to them. Return to the emergency room for worsening condition or new concerning symptoms. Follow up with your regular doctor. If you don't have a regular doctor use one of the numbers below to establish a primary care doctor.  Please be aware that if you are taking birth control pills, taking other prescriptions, ESPECIALLY ANTIBIOTICS may make the birth control ineffective - if this is the case, either do not engage in sexual activity or use alternative methods of birth control such as condoms until you have finished the medicine and your family doctor says it is OK to restart them. If you are on a blood thinner such as COUMADIN, be aware that any other medicine that you take may cause the coumadin to either work too much, or not enough - you should have your coumadin level rechecked in next 7 days if this is the case.  ?  It is also a possibility that you have an allergic reaction to any of the medicines that you have  been prescribed - Everybody reacts differently to medications and while MOST people have no trouble with most medicines, you may have a reaction such as nausea, vomiting, rash, swelling, shortness of breath. If this is the case, please stop taking the medicine immediately and contact your physician.  ?  You should return to the ER if you develop severe or worsening symptoms.   Emergency Department Resource Guide 1) Find a Doctor and Pay Out of Pocket Although you won't have to find out who is covered by your insurance plan, it is a good idea to ask around and get recommendations. You will then need to call the office and see if the doctor you have chosen will accept you as a new patient and what types of options they offer for patients who are self-pay. Some doctors offer discounts or will set up payment plans for their patients who do not have insurance, but you will need to ask so you aren't surprised when you get to your appointment.  2) Contact Your Local Health Department Not all health departments have doctors that can see patients for sick visits, but many do, so it is worth a call to see if yours does. If you don't know where your local health department is, you can check in your phone book. The CDC also has a tool to help you locate your state's health department, and many state websites also have listings of all of their local health departments.  3)  Find a Walk-in Clinic If your illness is not likely to be very severe or complicated, you may want to try a walk in clinic. These are popping up all over the country in pharmacies, drugstores, and shopping centers. They're usually staffed by nurse practitioners or physician assistants that have been trained to treat common illnesses and complaints. They're usually fairly quick and inexpensive. However, if you have serious medical issues or chronic medical problems, these are probably not your best option.  No Primary Care Doctor: Call Health  Connect at  [phone] - they can help you locate a primary care doctor that  accepts your insurance, provides certain services, etc. Physician Referral Service- [phone]  Chronic Pain Problems: Organization         Address  Phone   Notes  [facility]  [phone] Patients need to be referred by their primary care doctor.   Medication Assistance: Organization         Address  Phone   Notes  [facility] Medication [facility] [address] [place] [place], [place] [address] [phone] --Must be a resident of [facility] -- Must have NO insurance coverage whatsoever (no Medicaid/ Medicare, etc.) -- The pt. MUST have a primary care doctor that directs their care regularly and follows them in the community   [facility]  [phone]   [employer]  [phone]    Agencies that provide inexpensive medical care: Organization         Address  Phone   Notes  [facility]  [phone]   [facility]    [phone]   [facility] [address] [place], [place] [address] [phone]   [facility] [address] [place], [place] [phone]   [facility]    [phone]   [facility]    [phone]   [facility]  [address], [place] Phone:  [phone], Fax:  [phone] Hours of Operation:  9 am - 6 pm, M-F.  Also accepts Medicaid/Medicare and self-pay.  [facility]  [address], [place] Phone: [phone], Fax: [phone]. Hours of Operation:  8:30 am - 5:30 pm, M-F.  Also accepts Medicaid and self-pay.  [facility] [place] [address] [place] Phone: [phone]   [facility] [address] Natasha Bence [place], [place] [phone], Ext. [phone] Mondays & Thursdays: 7-9 AM.  First 15 patients are seen on a first come, first serve basis.     Medicaid-accepting [facility] Providers:  Organization         Address  Phone   Notes  [facility] [address], [place] [phone] Also accepts self-pay patients.  [facility] [address] Laurell Josephs [place], [place]  [phone]   [facility] [address], [place] [phone]   [facility] [address], [place] [phone]   Renaye Rakers [address], [place]   [phone] Only accepts [place] patients after they have their name applied to their card.   Self-Pay (no insurance) in [facility]:  Organization         Address  Phone   Notes  Sickle Cell Patients, [facility] [address] [place],  [place] [phone]   [facility] [address] [place], [place] [phone]   [facility]  [address], [place] [phone]   [facility]/Dr. Osei-Bonsu  [address], [place] or [address], [place] [phone] Phone number for both [place] and [place] locations is the same.  Urgent Medical and [facility] [address] [place] [phone]   [facility] [address], [place] or [address] [phone]   [facility] [address] [place] [phone], phone; [phone], fax Sees patients 1st and 3rd Saturday of every month.  Must not qualify for public or private insurance (i.e. Medicaid, Medicare, [place] Health Choice, Veterans' Benefits)  Household income should be no more than 200% of the poverty level The clinic cannot treat you if you are pregnant or think you are pregnant  Sexually transmitted diseases are not treated at the clinic.

## 2018-05-03 NOTE — ED Provider Notes (Signed)
[facility] EMERGENCY DEPARTMENT Provider Note   CSN: [id number] Arrival date & time: 05/03/18  9758    History   Chief Complaint Chief Complaint  Patient presents with  . Dental Pain    HPI Regina Rojas is a 28 y.o. female with a PMH of Bipolar disorder, depression, anemia, and sickle cell trait presenting with intermittent left upper dental pain onset 2 days ago. Patient reports pain as sharp. Patient reports touching tooth makes pain worse and pain medicine makes her pain better. Patient reports she took her grandmother's oxycodone earlier today with partial relief. Patient reports she has had dental problems in the past and states she has multiple cavities and broken teeth. Patient states she does not have a dentist. Patient reports a subjective fever a few days ago. Patient reports left sided facial swelling a few days ago, but states it has improved. Patient denies difficulty swallowing, shortness of breath, nausea, vomiting, abdominal pain, ear pain, or neck pain. Patient reports she was seen at [facility] ER a few hours ago and prescribed an antibiotic. Patient states she has not taken her antibiotic yet.      HPI  Past Medical History:  Diagnosis Date  . Anemia   . Bipolar disorder (HCC)   . Cervical dysplasia   . Depression   . Sickle cell trait [facility])     Patient Active Problem List   Diagnosis Date Noted  . Sickle cell trait (HCC)   . Depression   . Bipolar disorder (HCC)   . Anemia   . Cervical dysplasia     Past Surgical History:  Procedure Laterality Date  . COLPOSCOPY    . GYNECOLOGIC CRYOSURGERY       OB History    Gravida  2   Para      Term      Preterm      AB  2   Living  0     SAB  2   TAB      Ectopic      Multiple      Live Births               Home Medications    Prior to Admission medications   Medication Sig Start Date End Date Taking? Authorizing Provider  benzonatate (TESSALON) 100 MG capsule  Take 1 capsule (100 mg total) by mouth 3 (three) times daily as needed for cough. 03/14/18   Gilda Crease, MD  clindamycin (CLEOCIN) 150 MG capsule Take 3 capsules (450 mg total) by mouth 3 (three) times daily for 7 days. 05/03/18 05/10/18  Couture, Cortni S, PA-C  ibuprofen (ADVIL,MOTRIN) 800 MG tablet Take 1 tablet (800 mg total) by mouth every 6 (six) hours as needed for moderate pain. 03/14/18   Gilda Crease, MD  naproxen (NAPROSYN) 500 MG tablet Take 1 tablet (500 mg total) by mouth 2 (two) times daily. 05/03/18   Carlyle Basques P, PA-C  oseltamivir (TAMIFLU) 75 MG capsule Take 1 capsule (75 mg total) by mouth every 12 (twelve) hours. 03/14/18   Gilda Crease, MD    Family History Family History  Problem Relation Age of Onset  . Diabetes Mother   . Hypertension Mother   . Hypertension Sister   . Heart disease Maternal Grandmother     Social History Social History   Tobacco Use  . Smoking status: Current Every Day Smoker    Packs/day: 1.00    Types: Cigarettes  .  Smokeless tobacco: Never Used  Substance Use Topics  . Alcohol use: Yes    Comment: occ  . Drug use: Yes    Types: Marijuana     Allergies   Penicillins   Review of Systems Review of Systems  Constitutional: Positive for fever. Negative for chills and diaphoresis.  HENT: Positive for dental problem and facial swelling. Negative for congestion, drooling, ear pain, rhinorrhea, sore throat, trouble swallowing and voice change.   Respiratory: Negative for cough and shortness of breath.   Cardiovascular: Negative for chest pain.  Gastrointestinal: Negative for abdominal pain, nausea and vomiting.  Endocrine: Negative for cold intolerance and heat intolerance.  Musculoskeletal: Negative for neck pain.  Skin: Negative for color change.  Allergic/Immunologic: Negative for immunocompromised state.  Hematological: Negative for adenopathy.    Physical Exam Updated Vital Signs BP (!) 132/93  (BP Location: Right Arm)   Pulse 94   Temp 98.2 F (36.8 C) (Oral)   Resp 16   Ht 5\' 2"  (1.575 m)   Wt 87.1 kg   SpO2 100%   BMI 35.12 kg/m   Physical Exam Physical Exam  Constitutional: Pt appears well-developed and well-nourished.  HENT:  Head: Normocephalic.  Right Ear: Tympanic membrane, external ear and ear canal normal.  Left Ear: Tympanic membrane, external ear and ear canal normal.  Nose: Nose normal. Right sinus exhibits no maxillary sinus tenderness and no frontal sinus tenderness. Left sinus exhibits no maxillary sinus tenderness and no frontal sinus tenderness.  Mouth/Throat: Uvula is midline, oropharynx is clear and moist and mucous membranes are normal. No oral lesions. Abnormal dentition. Dental Caries present. Tooth #18 and #15 are broken. No uvula swelling or lacerations. No oropharyngeal exudate, posterior oropharyngeal edema, posterior oropharyngeal erythema or tonsillar abscesses.  No gingival swelling, fluctuance or induration. No gross abscess. No signs of Ludwig's angina noted. Eyes: Conjunctivae are normal. Pupils are equal, round, and reactive to light. Right eye exhibits no discharge. Left eye exhibits no discharge.  Neck: Normal range of motion. Neck supple. No stridor. Handling secretions without difficulty. No nuchal rigidity. No cervical lymphadenopathy Cardiovascular: Normal rate, regular rhythm and normal heart sounds.   Pulmonary/Chest: Effort normal. No respiratory distress. Equal chest rise. Abdominal: Abdomen is soft and non tender. Pt exhibits no distension.  Lymphadenopathy: Pt has no cervical adenopathy.  Neurological: Pt is alert.  Skin: Skin is warm and dry.  Psychiatric: Pt has a normal mood and affect.  Nursing note and vitals reviewed.   ED Treatments / Results  Labs (all labs ordered are listed, but only abnormal results are displayed) Labs Reviewed - No data to display  EKG None  Radiology No results found.  Procedures  Procedures (including critical care time)  Medications Ordered in ED Medications  ketorolac (TORADOL) 30 MG/ML injection 30 mg (30 mg Intramuscular Given 05/03/18 1021)     Initial Impression / Assessment and Plan / ED Course  I have reviewed the triage vital signs and the nursing notes.  Pertinent labs & imaging results that were available during my care of the patient were reviewed by me and considered in my medical decision making (see chart for details).       Patient with toothache.  No gross abscess.  Exam unconcerning for Ludwig's angina or spread of infection.  Provided Toradol in the ER. Patient was prescribed clindamycin earlier today. Will advise patient to start antibiotics as prescribed. Will prescribe naproxen for pain. Advised patient to not take medications that are not prescribed  to her. Discussed return precautions with patient. Urged patient to follow-up with dentist.  Provided resources. Patient states she understands and agrees with plan.    Final Clinical Impressions(s) / ED Diagnoses   Final diagnoses:  Pain, dental    ED Discharge Orders         Ordered    naproxen (NAPROSYN) 500 MG tablet  2 times daily     05/03/18 [address] [place], [place] 05/03/18 1027    Maia Plan, MD 05/04/18 [phone]

## 2018-05-03 NOTE — ED Triage Notes (Signed)
Pt reports left side dental pain X2 days. Minimal swelling noted. Pt reports taking oxycodone at 0300. Tearful in triage, afebrile.

## 2018-05-03 NOTE — ED Provider Notes (Signed)
[facility] EMERGENCY DEPARTMENT Provider Note   CSN: [id number] Arrival date & time: 05/03/18  2836    History   Chief Complaint Chief Complaint  Patient presents with  . Dental Pain    HPI Regina Rojas is a 28 y.o. female.     HPI   Pt is a 28 y/o female with a h/o anemia, bipolar disorder, cervical dysplasia, depression, sickle cell trait, who presents the emergency department today complaining of left lower dental pain.  Pain has been present for about 2 days.  Pain is constant.  Has tried Tylenol at home without significant relief.  States she thinks she had a temperature yesterday.  No swelling to the face.  Does not currently have a dentist.  Past Medical History:  Diagnosis Date  . Anemia   . Bipolar disorder (HCC)   . Cervical dysplasia   . Depression   . Sickle cell trait [facility])     Patient Active Problem List   Diagnosis Date Noted  . Sickle cell trait (HCC)   . Depression   . Bipolar disorder (HCC)   . Anemia   . Cervical dysplasia     Past Surgical History:  Procedure Laterality Date  . COLPOSCOPY    . GYNECOLOGIC CRYOSURGERY       OB History    Gravida  2   Para      Term      Preterm      AB  2   Living  0     SAB  2   TAB      Ectopic      Multiple      Live Births               Home Medications    Prior to Admission medications   Medication Sig Start Date End Date Taking? Authorizing Provider  benzonatate (TESSALON) 100 MG capsule Take 1 capsule (100 mg total) by mouth 3 (three) times daily as needed for cough. 03/14/18   Gilda Crease, MD  clindamycin (CLEOCIN) 150 MG capsule Take 3 capsules (450 mg total) by mouth 3 (three) times daily for 7 days. 05/03/18 05/10/18  Maelee Hoot S, PA-C  ibuprofen (ADVIL,MOTRIN) 800 MG tablet Take 1 tablet (800 mg total) by mouth every 6 (six) hours as needed for moderate pain. 03/14/18   Gilda Crease, MD  oseltamivir (TAMIFLU) 75 MG capsule  Take 1 capsule (75 mg total) by mouth every 12 (twelve) hours. 03/14/18   Gilda Crease, MD    Family History Family History  Problem Relation Age of Onset  . Diabetes Mother   . Hypertension Mother   . Hypertension Sister   . Heart disease Maternal Grandmother     Social History Social History   Tobacco Use  . Smoking status: Current Every Day Smoker    Packs/day: 1.00    Types: Cigarettes  . Smokeless tobacco: Never Used  Substance Use Topics  . Alcohol use: Yes    Comment: occ  . Drug use: Yes    Types: Marijuana     Allergies   Penicillins   Review of Systems Review of Systems  Constitutional: Positive for fever.  HENT: Positive for dental problem. Negative for trouble swallowing.   Respiratory: Negative for shortness of breath.   Cardiovascular: Negative for chest pain.  Gastrointestinal: Negative for nausea and vomiting.     Physical Exam Updated Vital Signs BP (!) 139/101 (BP Location: Right  Arm)   Pulse 97   Temp 98.3 F (36.8 C) (Oral)   Resp 18   SpO2 100%   Physical Exam Vitals signs and nursing note reviewed.  Constitutional:      General: She is not in acute distress.    Appearance: She is well-developed.  HENT:     Head: Normocephalic and atraumatic.     Mouth/Throat:     Comments: Multiple dental caries. Tooth #18 is fractured and TTP. No periapical abscess. No sublingual or submandibular swelling. No trismus. Tolerating secretions. Eyes:     Conjunctiva/sclera: Conjunctivae normal.  Neck:     Musculoskeletal: Neck supple.  Cardiovascular:     Rate and Rhythm: Normal rate.  Pulmonary:     Effort: Pulmonary effort is normal.  Musculoskeletal: Normal range of motion.  Skin:    General: Skin is warm and dry.  Neurological:     Mental Status: She is alert.      ED Treatments / Results  Labs (all labs ordered are listed, but only abnormal results are displayed) Labs Reviewed - No data to display  EKG None   Radiology No results found.  Procedures Procedures (including critical care time)  Medications Ordered in ED Medications - No data to display   Initial Impression / Assessment and Plan / ED Course  I have reviewed the triage vital signs and the nursing notes.  Pertinent labs & imaging results that were available during my care of the patient were reviewed by me and considered in my medical decision making (see chart for details).     Final Clinical Impressions(s) / ED Diagnoses   Final diagnoses:  Pain, dental   Patient with toothache.  No gross abscess.  Exam unconcerning for Ludwig's angina or spread of infection.  Will treat with clindamycin and pain medicine.  Urged patient to follow-up with dentist.  Resources given. Return precautions discussed. Pt voices understanding and is in agreement with plan. All questions answered.  ED Discharge Orders         Ordered    clindamycin (CLEOCIN) 150 MG capsule  3 times daily     05/03/18 0839           Karrie MeresCouture, Kelli Robeck S, PA-C 05/03/18 0850    Gerhard MunchLockwood, Robert, MD 05/03/18 [phone]

## 2018-05-27 ENCOUNTER — Emergency Department (HOSPITAL_BASED_OUTPATIENT_CLINIC_OR_DEPARTMENT_OTHER)
Admission: EM | Admit: 2018-05-27 | Discharge: 2018-05-27 | Disposition: A | Discharge status: Home / Self Care | Payer: Self-pay | Attending: Emergency Medicine | Admitting: Emergency Medicine

## 2018-05-27 ENCOUNTER — Encounter (HOSPITAL_BASED_OUTPATIENT_CLINIC_OR_DEPARTMENT_OTHER): Payer: Self-pay | Admitting: Emergency Medicine

## 2018-05-27 ENCOUNTER — Other Ambulatory Visit: Payer: Self-pay

## 2018-05-27 DIAGNOSIS — M545 Low back pain, unspecified: Secondary | ICD-10-CM

## 2018-05-27 DIAGNOSIS — F17210 Nicotine dependence, cigarettes, uncomplicated: Secondary | ICD-10-CM | POA: Insufficient documentation

## 2018-05-27 DIAGNOSIS — Z79899 Other long term (current) drug therapy: Secondary | ICD-10-CM | POA: Insufficient documentation

## 2018-05-27 MED ORDER — CYCLOBENZAPRINE HCL 10 MG PO TABS: 10.0000 mg | ORAL_TABLET | Freq: Two times a day (BID) | ORAL | 0 refills | Status: DC | PRN

## 2018-05-27 MED ORDER — NAPROXEN 500 MG PO TABS: 500.0000 mg | ORAL_TABLET | Freq: Two times a day (BID) | ORAL | 0 refills | Status: DC

## 2018-05-27 NOTE — ED Triage Notes (Signed)
Lower back pain bilaterally for 2 days. Reports heavy lifting. Ambulatory with a  Steady gait. No loss of control of B&B

## 2018-05-27 NOTE — Discharge Instructions (Addendum)
Naproxen as prescribed.  Flexeril as prescribed as needed for pain not relieved with naproxen.  Follow-up with your primary doctor if symptoms or not improving in the next week, and return to the ER if symptoms significantly worsen or change.

## 2018-05-27 NOTE — ED Provider Notes (Signed)
[facility] EMERGENCY DEPARTMENT Provider Note   CSN: [id number] Arrival date & time: 05/27/18  2041    History   Chief Complaint Chief Complaint  Patient presents with  . Back Pain    HPI Regina Rojas is a 28 y.o. female.     Patient is a 28 year old female with history of bipolar disorder, depression, and anemia.  She presents today with complaints of back pain.  She describes pain in her lumbar region that occasionally radiates into her left buttock.  This is been present for the past 2-1/2 days.  She believes she may have injured herself lifting heavy objects at work.  She denies any bowel or bladder complaints.  She denies any weakness.  The history is provided by the patient.  Back Pain  Location:  Lumbar spine Quality:  Stabbing and stiffness Stiffness is present:  All day Radiates to: Left buttock. Pain severity:  Moderate Onset quality:  Gradual Timing:  Constant Progression:  Worsening Chronicity:  New   Past Medical History:  Diagnosis Date  . Anemia   . Bipolar disorder (HCC)   . Cervical dysplasia   . Depression   . Sickle cell trait [facility])     Patient Active Problem List   Diagnosis Date Noted  . Sickle cell trait (HCC)   . Depression   . Bipolar disorder (HCC)   . Anemia   . Cervical dysplasia     Past Surgical History:  Procedure Laterality Date  . COLPOSCOPY    . GYNECOLOGIC CRYOSURGERY       OB History    Gravida  2   Para      Term      Preterm      AB  2   Living  0     SAB  2   TAB      Ectopic      Multiple      Live Births               Home Medications    Prior to Admission medications   Medication Sig Start Date End Date Taking? Authorizing Provider  benzonatate (TESSALON) 100 MG capsule Take 1 capsule (100 mg total) by mouth 3 (three) times daily as needed for cough. 03/14/18   Gilda Crease, MD  ibuprofen (ADVIL,MOTRIN) 800 MG tablet Take 1 tablet (800 mg total) by mouth  every 6 (six) hours as needed for moderate pain. 03/14/18   Gilda Crease, MD  naproxen (NAPROSYN) 500 MG tablet Take 1 tablet (500 mg total) by mouth 2 (two) times daily. 05/03/18   Carlyle Basques P, PA-C  oseltamivir (TAMIFLU) 75 MG capsule Take 1 capsule (75 mg total) by mouth every 12 (twelve) hours. 03/14/18   Gilda Crease, MD    Family History Family History  Problem Relation Age of Onset  . Diabetes Mother   . Hypertension Mother   . Hypertension Sister   . Heart disease Maternal Grandmother     Social History Social History   Tobacco Use  . Smoking status: Current Every Day Smoker    Packs/day: 1.00    Types: Cigarettes  . Smokeless tobacco: Never Used  Substance Use Topics  . Alcohol use: Yes    Comment: occ  . Drug use: Yes    Types: Marijuana     Allergies   Penicillins   Review of Systems Review of Systems  Musculoskeletal: Positive for back pain.  All other systems reviewed  and are negative.    Physical Exam Updated Vital Signs BP 115/83   Pulse 93   Temp 98 F (36.7 C)   Resp 16   Ht 5\' 3"  (1.6 m)   Wt 83.9 kg   LMP 05/23/2018 (Within Days)   SpO2 100%   BMI 32.77 kg/m   Physical Exam Vitals signs and nursing note reviewed.  Constitutional:      Appearance: Normal appearance.  HENT:     Head: Normocephalic.  Pulmonary:     Effort: Pulmonary effort is normal.  Musculoskeletal:     Comments: There is tenderness to palpation in the soft tissues of the lower lumbar region.  There is no bony tenderness or step-off.  Skin:    General: Skin is warm and dry.  Neurological:     Mental Status: She is alert.     Comments: DTRs are 3+ and symmetrical in the patellar and Achilles tendons bilaterally.  Strength is 5 out of 5 in both lower extremities.  She is able to ambulate on her heels and toes without difficulty.      ED Treatments / Results  Labs (all labs ordered are listed, but only abnormal results are displayed)  Labs Reviewed - No data to display  EKG None  Radiology No results found.  Procedures Procedures (including critical care time)  Medications Ordered in ED Medications - No data to display   Initial Impression / Assessment and Plan / ED Course  I have reviewed the triage vital signs and the nursing notes.  Pertinent labs & imaging results that were available during my care of the patient were reviewed by me and considered in my medical decision making (see chart for details).  Patient's pain most likely musculoskeletal in nature.  There are no red flags that would suggest an emergently surgical situation.  Patient will be treated with anti-inflammatories, muscle relaxers, and follow-up as needed.  Final Clinical Impressions(s) / ED Diagnoses   Final diagnoses:  None    ED Discharge Orders    None       Geoffery Lyonselo, Tahni Porchia, MD 05/27/18 2107

## 2018-07-27 ENCOUNTER — Inpatient Hospital Stay (HOSPITAL_COMMUNITY)
Admission: AD | Admit: 2018-07-27 | Discharge: 2018-07-28 | Disposition: A | Discharge status: Home / Self Care | Payer: Self-pay | Attending: Obstetrics & Gynecology | Admitting: Obstetrics & Gynecology

## 2018-07-27 ENCOUNTER — Other Ambulatory Visit: Payer: Self-pay

## 2018-07-27 ENCOUNTER — Encounter (HOSPITAL_COMMUNITY): Payer: Self-pay | Admitting: *Deleted

## 2018-07-27 DIAGNOSIS — Z3202 Encounter for pregnancy test, result negative: Secondary | ICD-10-CM | POA: Insufficient documentation

## 2018-07-27 DIAGNOSIS — Z880 Allergy status to penicillin: Secondary | ICD-10-CM | POA: Insufficient documentation

## 2018-07-27 DIAGNOSIS — N939 Abnormal uterine and vaginal bleeding, unspecified: Secondary | ICD-10-CM | POA: Insufficient documentation

## 2018-07-27 DIAGNOSIS — F17210 Nicotine dependence, cigarettes, uncomplicated: Secondary | ICD-10-CM | POA: Insufficient documentation

## 2018-07-27 LAB — POCT PREGNANCY, URINE: Preg Test, Ur: NEGATIVE

## 2018-07-27 NOTE — MAU Note (Signed)
Had positive upt first of July. Had light bleeding 3 days ago and stopped. Had intercourse later and had a lot of bleeding afterward. Last few days having a lot of bleeding and clotting. Tonight had large dark clot. Had a SAB Nov 2018. Having some lower abd pain and lower back pain.

## 2018-07-27 NOTE — MAU Provider Note (Signed)
Chief Complaint: Vaginal Bleeding   First Provider Initiated Contact with Patient 07/27/18 2318      SUBJECTIVE HPI: Regina Rojas is a 28 y.o. G3P0020 who presents to MAU reporting (+) HPT on 7/1. She reports she had light bleeding on 7/7 and stopped. She has SI later that day and started bleeding heavily afterwards. She reports that the last few days she has had a lot lf VB and clotting. She reports that tonight she had a large blood clot come out and some lower abdominal pain/cramping and lower back pain. She had a SAB in 11/2016. She is concerned she is having another SAB. She goes to Dr. Audie BoxFontaine, but "has not seen him a long while."   Past Medical History:  Diagnosis Date  . Anemia   . Bipolar disorder (HCC)   . Cervical dysplasia   . Depression   . Sickle cell trait [facility](HCC)    Past Surgical History:  Procedure Laterality Date  . COLPOSCOPY    . GYNECOLOGIC CRYOSURGERY     Social History   Socioeconomic History  . Marital status: Legally Separated    Spouse name: Not on file  . Number of children: Not on file  . Years of education: Not on file  . Highest education level: Not on file  Occupational History  . Not on file  Social Needs  . Financial resource strain: Not on file  . Food insecurity    Worry: Not on file    Inability: Not on file  . Transportation needs    Medical: Not on file    Non-medical: Not on file  Tobacco Use  . Smoking status: Current Every Day Smoker    Packs/day: 1.00    Types: Cigarettes  . Smokeless tobacco: Never Used  Substance and Sexual Activity  . Alcohol use: Yes    Comment: occ  . Drug use: Yes    Types: Marijuana  . Sexual activity: Yes    Birth control/protection: None  Lifestyle  . Physical activity    Days per week: Not on file    Minutes per session: Not on file  . Stress: Not on file  Relationships  . Social Musicianconnections    Talks on phone: Not on file    Gets together: Not on file    Attends religious service:  Not on file    Active member of club or organization: Not on file    Attends meetings of clubs or organizations: Not on file    Relationship status: Not on file  . Intimate partner violence    Fear of current or ex partner: Not on file    Emotionally abused: Not on file    Physically abused: Not on file    Forced sexual activity: Not on file  Other Topics Concern  . Not on file  Social History Narrative  . Not on file   No current facility-administered medications on file prior to encounter.    Current Outpatient Medications on File Prior to Encounter  Medication Sig Dispense Refill  . benzonatate (TESSALON) 100 MG capsule Take 1 capsule (100 mg total) by mouth 3 (three) times daily as needed for cough. 20 capsule 0  . cyclobenzaprine (FLEXERIL) 10 MG tablet Take 1 tablet (10 mg total) by mouth 2 (two) times daily as needed for muscle spasms. 20 tablet 0  . ibuprofen (ADVIL,MOTRIN) 800 MG tablet Take 1 tablet (800 mg total) by mouth every 6 (six) hours as needed for moderate  pain. 20 tablet 0  . naproxen (NAPROSYN) 500 MG tablet Take 1 tablet (500 mg total) by mouth 2 (two) times daily with a meal. 20 tablet 0  . oseltamivir (TAMIFLU) 75 MG capsule Take 1 capsule (75 mg total) by mouth every 12 (twelve) hours. 10 capsule 0   Allergies  Allergen Reactions  . Penicillins Itching    ROS:  Review of Systems  Constitutional: Negative.   HENT: Negative.   Eyes: Negative.   Respiratory: Negative.   Cardiovascular: Negative.   Gastrointestinal: Negative.   Endocrine: Negative.   Genitourinary: Positive for pelvic pain and vaginal bleeding.  Musculoskeletal: Positive for back pain.  Skin: Negative.   Allergic/Immunologic: Negative.   Neurological: Negative.   Hematological: Negative.   Psychiatric/Behavioral: Negative.     I have reviewed patient's Past Medical Hx, Surgical Hx, Family Hx, Social Hx, medications and allergies.   Physical Exam   Patient Vitals for the past 24  hrs:  Temp Resp Height Weight  07/27/18 2243 98 F (36.7 C) 16 5\' 5"  (1.651 m) 83 kg   Physical Exam  Nursing note and vitals reviewed. Constitutional: She is oriented to person, place, and time. She appears well-developed and well-nourished.  HENT:  Head: Normocephalic and atraumatic.  Eyes: Pupils are equal, round, and reactive to light.  Neck: Normal range of motion.  Cardiovascular: Normal rate.  Respiratory: Effort normal.  GI: Soft.  Genitourinary:    Genitourinary Comments: Pelvic deferred   Musculoskeletal: Normal range of motion.  Neurological: She is alert and oriented to person, place, and time.  Skin: Skin is warm and dry.  Psychiatric: She has a normal mood and affect. Her behavior is normal. Judgment and thought content normal.    MDM Patient denies any concerning symptoms in need of emergent evaluation and negative UPT & HCG results today. Patient advised that she may choose to be discharged to seek non-emergent evaluation of her complaint at [facility], Urgent care or her PCP.   ASSESSMENT MSE Complete Negative pregnancy test   PLAN - Discharge patient at her request to seek non-emergent medical care elsewhere - Advised to F/U with Dr. Phineas Real - Patient verbalized an understanding of the plan of care and agrees.   Laury Deep, CNM 07/27/2018 11:50 PM

## 2018-07-28 DIAGNOSIS — Z3202 Encounter for pregnancy test, result negative: Secondary | ICD-10-CM

## 2018-07-28 LAB — HCG, QUANTITATIVE, PREGNANCY: hCG, Beta Chain, Quant, S: <1 m[IU]/mL (ref <5)

## 2018-07-28 NOTE — Progress Notes (Signed)
Rolitta Dawson CNM in earlier to discuss test results and d/c plan. Written and verbal d/c instructions given and understanding voiced. 

## 2018-11-25 ENCOUNTER — Encounter (HOSPITAL_BASED_OUTPATIENT_CLINIC_OR_DEPARTMENT_OTHER): Payer: Self-pay | Admitting: Emergency Medicine

## 2018-11-25 ENCOUNTER — Emergency Department (HOSPITAL_BASED_OUTPATIENT_CLINIC_OR_DEPARTMENT_OTHER)
Admission: EM | Admit: 2018-11-25 | Discharge: 2018-11-25 | Disposition: A | Discharge status: Home / Self Care | Payer: Self-pay | Attending: Emergency Medicine | Admitting: Emergency Medicine

## 2018-11-25 ENCOUNTER — Other Ambulatory Visit: Payer: Self-pay

## 2018-11-25 ENCOUNTER — Emergency Department (HOSPITAL_BASED_OUTPATIENT_CLINIC_OR_DEPARTMENT_OTHER): Payer: Self-pay

## 2018-11-25 DIAGNOSIS — Z79899 Other long term (current) drug therapy: Secondary | ICD-10-CM | POA: Insufficient documentation

## 2018-11-25 DIAGNOSIS — Y999 Unspecified external cause status: Secondary | ICD-10-CM | POA: Insufficient documentation

## 2018-11-25 DIAGNOSIS — X509XXA Other and unspecified overexertion or strenuous movements or postures, initial encounter: Secondary | ICD-10-CM | POA: Insufficient documentation

## 2018-11-25 DIAGNOSIS — S99912A Unspecified injury of left ankle, initial encounter: Secondary | ICD-10-CM | POA: Insufficient documentation

## 2018-11-25 DIAGNOSIS — F17210 Nicotine dependence, cigarettes, uncomplicated: Secondary | ICD-10-CM | POA: Insufficient documentation

## 2018-11-25 DIAGNOSIS — Y9289 Other specified places as the place of occurrence of the external cause: Secondary | ICD-10-CM | POA: Insufficient documentation

## 2018-11-25 DIAGNOSIS — Y9301 Activity, walking, marching and hiking: Secondary | ICD-10-CM | POA: Insufficient documentation

## 2018-11-25 MED ORDER — IBUPROFEN 400 MG PO TABS
600.0000 mg | ORAL_TABLET | Freq: Once | ORAL | Status: AC
  Administered 2018-11-25: 600 mg via ORAL
  Filled 2018-11-25: qty 1

## 2018-11-25 NOTE — ED Triage Notes (Signed)
L ankle pain and swelling after falling while hiking yesterday.

## 2018-11-25 NOTE — ED Provider Notes (Signed)
[facility] EMERGENCY DEPARTMENT Provider Note   CSN: [id number] Arrival date & time: 11/25/18  7371     History   Chief Complaint Chief Complaint  Patient presents with  . Ankle Injury    HPI Regina Rojas is a 28 y.o. female.     HPI   Regina Rojas is a 28 y.o. female, with a history of anemia and bipolar, presenting to the ED with left ankle injury that occurred yesterday while hiking.  Patient states she inverted her foot.  Pain is throbbing, moderate, nonradiating.  She took Tylenol yesterday.  Currently menstruating.  She has been ambulatory since the incident. Denies head injury, neck/back pain, numbness, weakness, other pain or injuries, or any other complaints.    Past Medical History:  Diagnosis Date  . Anemia   . Bipolar disorder (HCC)   . Cervical dysplasia   . Depression   . Sickle cell trait [facility])     Patient Active Problem List   Diagnosis Date Noted  . Negative pregnancy test 07/28/2018  . Sickle cell trait (HCC)   . Depression   . Bipolar disorder (HCC)   . Anemia   . Cervical dysplasia     Past Surgical History:  Procedure Laterality Date  . COLPOSCOPY    . GYNECOLOGIC CRYOSURGERY       OB History    Gravida  3   Para      Term      Preterm      AB  2   Living  0     SAB  2   TAB      Ectopic      Multiple      Live Births               Home Medications    Prior to Admission medications   Medication Sig Start Date End Date Taking? Authorizing Provider  ibuprofen (ADVIL,MOTRIN) 800 MG tablet Take 1 tablet (800 mg total) by mouth every 6 (six) hours as needed for moderate pain. 03/14/18   Gilda Crease, MD  naproxen (NAPROSYN) 500 MG tablet Take 1 tablet (500 mg total) by mouth 2 (two) times daily with a meal. 05/27/18   Geoffery Lyons, MD    Family History Family History  Problem Relation Age of Onset  . Diabetes Mother   . Hypertension Mother   . Hypertension Sister   . Heart  disease Maternal Grandmother     Social History Social History   Tobacco Use  . Smoking status: Current Every Day Smoker    Packs/day: 1.00    Types: Cigarettes  . Smokeless tobacco: Never Used  Substance Use Topics  . Alcohol use: Yes    Comment: occ  . Drug use: Yes    Types: Marijuana     Allergies   Penicillins   Review of Systems Review of Systems  Musculoskeletal: Positive for arthralgias and joint swelling. Negative for back pain and neck pain.  Neurological: Negative for weakness and numbness.     Physical Exam Updated Vital Signs BP 118/77 (BP Location: Right Arm)   Pulse 90   Temp 98.4 F (36.9 C) (Oral)   Resp 16   Ht 5\' 5"  (1.651 m)   Wt 83.5 kg   LMP 10/23/2018   SpO2 100%   Breastfeeding Unknown   BMI 30.62 kg/m   Physical Exam Vitals signs and nursing note reviewed.  Constitutional:      General: She  is not in acute distress.    Appearance: She is well-developed. She is not diaphoretic.  HENT:     Head: Normocephalic and atraumatic.  Eyes:     Conjunctiva/sclera: Conjunctivae normal.  Neck:     Musculoskeletal: Neck supple.  Cardiovascular:     Rate and Rhythm: Normal rate and regular rhythm.     Pulses:          Dorsalis pedis pulses are 2+ on the left side.       Posterior tibial pulses are 2+ on the left side.  Pulmonary:     Effort: Pulmonary effort is normal.  Musculoskeletal:     Left ankle: She exhibits swelling. She exhibits no deformity. Tenderness. Lateral malleolus tenderness found.     Comments: Tenderness and swelling over the left lateral malleolus.  Full range of motion intact with plantar dorsiflexion.  Skin:    General: Skin is warm and dry.     Coloration: Skin is not pale.  Neurological:     Mental Status: She is alert.     Comments: Sensation to light touch grossly intact in the left foot and toes. Strength 5/5 with plantar and dorsiflexion of the left ankle.  Psychiatric:        Behavior: Behavior normal.       ED Treatments / Results  Labs (all labs ordered are listed, but only abnormal results are displayed) Labs Reviewed - No data to display  EKG None  Radiology Dg Ankle Complete Left  Result Date: 11/25/2018 CLINICAL DATA:  Fall, pain EXAM: LEFT ANKLE COMPLETE - 3+ VIEW COMPARISON:  None. FINDINGS: No fracture or dislocation is seen. The ankle mortise is intact. Mild lateral soft tissue swelling. IMPRESSION: No fracture or dislocation is seen. Mild lateral soft tissue swelling. Electronically Signed   By: Julian Hy M.D.   On: 11/25/2018 10:18    Procedures Procedures (including critical care time)  Medications Ordered in ED Medications  ibuprofen (ADVIL) tablet 600 mg (600 mg Oral Given 11/25/18 9470)     Initial Impression / Assessment and Plan / ED Course  I have reviewed the triage vital signs and the nursing notes.  Pertinent labs & imaging results that were available during my care of the patient were reviewed by me and considered in my medical decision making (see chart for details).        Patient presents with left ankle injury that occurred yesterday.  No evidence of neurovascular compromise.  No acute osseous injury on x-ray.  Patient given joint support and orthopedic referral.  Crutches were recommended, but patient declined.  She is ambulatory with limping gait. The patient was given instructions for home care as well as return precautions. Patient voices understanding of these instructions, accepts the plan, and is comfortable with discharge.  Final Clinical Impressions(s) / ED Diagnoses   Final diagnoses:  Injury of left ankle, initial encounter    ED Discharge Orders    None       Regina Rojas 11/26/18 9628    Virgel Manifold, MD 11/26/18 1112

## 2018-11-25 NOTE — Discharge Instructions (Signed)
You have been seen today for an ankle injury. There were no acute abnormalities on the x-rays, including no sign of fracture or dislocation, however, there could be injuries to the soft tissues, such as the ligaments or tendons that are not seen on xrays. There could also be what are called occult fractures that are small fractures not seen on xray. Antiinflammatory medications: Take 600 mg of ibuprofen every 6 hours or 440 mg (over the counter dose) to 500 mg (prescription dose) of naproxen every 12 hours for the next 3 days. After this time, these medications may be used as needed for pain. Take these medications with food to avoid upset stomach. Choose only one of these medications, do not take them together. Acetaminophen (generic for Tylenol): Should you continue to have additional pain while taking the ibuprofen or naproxen, you may add in acetaminophen as needed. Your daily total maximum amount of acetaminophen from all sources should be limited to 4000mg /day for persons without liver problems, or 2000mg /day for those with liver problems. Ice: May apply ice to the area over the next 24 hours for 15 minutes at a time to reduce swelling. Elevation: Keep the extremity elevated as often as possible to reduce pain and inflammation. Support: Wear the ace wrap for support and comfort. Wear this until pain resolves. You will be weight-bearing as tolerated, which means you can slowly start to put weight on the extremity and increase amount and frequency as pain allows. Exercises: Start by performing these exercises a few times a week, increasing the frequency until you are performing them twice daily.  Follow up: If symptoms are improving, you may follow up with your primary care provider for any continued management. If symptoms are not starting to improve within a week, you should follow up with the orthopedic specialist within two weeks. Return: Return to the ED for numbness, weakness, increasing pain,  overall worsening symptoms, loss of function, or if symptoms are not improving, you have tried to follow up with the orthopedic specialist, and have been unable to do so.  For prescription assistance, may try using prescription discount sites or apps, such as goodrx.com

## 2018-11-25 NOTE — ED Notes (Signed)
Pt declines crutches, states does not feel she can use crutches safely at home.  Ace applied as prescribed, pt states able to bear weight with added support.

## 2019-03-25 ENCOUNTER — Emergency Department (HOSPITAL_BASED_OUTPATIENT_CLINIC_OR_DEPARTMENT_OTHER)
Admission: EM | Admit: 2019-03-25 | Discharge: 2019-03-25 | Disposition: A | Discharge status: Home / Self Care | Payer: Self-pay | Attending: Emergency Medicine | Admitting: Emergency Medicine

## 2019-03-25 ENCOUNTER — Other Ambulatory Visit: Payer: Self-pay

## 2019-03-25 ENCOUNTER — Encounter (HOSPITAL_BASED_OUTPATIENT_CLINIC_OR_DEPARTMENT_OTHER): Payer: Self-pay | Admitting: Emergency Medicine

## 2019-03-25 DIAGNOSIS — K0889 Other specified disorders of teeth and supporting structures: Secondary | ICD-10-CM | POA: Insufficient documentation

## 2019-03-25 DIAGNOSIS — F17210 Nicotine dependence, cigarettes, uncomplicated: Secondary | ICD-10-CM | POA: Insufficient documentation

## 2019-03-25 DIAGNOSIS — Z79899 Other long term (current) drug therapy: Secondary | ICD-10-CM | POA: Insufficient documentation

## 2019-03-25 MED ORDER — CLINDAMYCIN HCL 150 MG PO CAPS: 150.0000 mg | ORAL_CAPSULE | Freq: Four times a day (QID) | ORAL | 0 refills | Status: DC

## 2019-03-25 MED ORDER — NAPROXEN 500 MG PO TABS: 500.0000 mg | ORAL_TABLET | Freq: Two times a day (BID) | ORAL | 0 refills | Status: DC

## 2019-03-25 MED ORDER — KETOROLAC TROMETHAMINE 30 MG/ML IJ SOLN
30.0000 mg | Freq: Once | INTRAMUSCULAR | Status: AC
  Administered 2019-03-25: 30 mg via INTRAMUSCULAR
  Filled 2019-03-25: qty 1

## 2019-03-25 NOTE — ED Provider Notes (Signed)
[facility] EMERGENCY DEPARTMENT Provider Note   CSN: [id number] Arrival date & time: 03/25/19  9767     History Chief Complaint  Patient presents with  . Dental Pain    Regina Rojas is a 29 y.o. female.  HPI     This is a 29 year old female who presents with dental pain.  Patient reports 1 day history of left upper dental pain.  She denies any trouble swallowing or recent dental procedures.  She has taken [facility] powder with minimal relief.  She rates her pain an 8 out of 10.  History of requiring extraction in the past for similar pain.  Past Medical History:  Diagnosis Date  . Anemia   . Bipolar disorder (HCC)   . Cervical dysplasia   . Depression   . Sickle cell trait [facility])     Patient Active Problem List   Diagnosis Date Noted  . Negative pregnancy test 07/28/2018  . Sickle cell trait (HCC)   . Depression   . Bipolar disorder (HCC)   . Anemia   . Cervical dysplasia     Past Surgical History:  Procedure Laterality Date  . COLPOSCOPY    . GYNECOLOGIC CRYOSURGERY       OB History    Gravida  3   Para      Term      Preterm      AB  2   Living  0     SAB  2   TAB      Ectopic      Multiple      Live Births              Family History  Problem Relation Age of Onset  . Diabetes Mother   . Hypertension Mother   . Hypertension Sister   . Heart disease Maternal Grandmother     Social History   Tobacco Use  . Smoking status: Current Every Day Smoker    Packs/day: 1.00    Types: Cigarettes  . Smokeless tobacco: Never Used  Substance Use Topics  . Alcohol use: Yes    Comment: occ  . Drug use: Yes    Types: Marijuana    Home Medications Prior to Admission medications   Medication Sig Start Date End Date Taking? Authorizing Provider  clindamycin (CLEOCIN) 150 MG capsule Take 1 capsule (150 mg total) by mouth 4 (four) times daily. 03/25/19   Maylani Embree, Mayer Masker, MD  ibuprofen (ADVIL,MOTRIN) 800 MG tablet Take 1 tablet  (800 mg total) by mouth every 6 (six) hours as needed for moderate pain. 03/14/18   Gilda Crease, MD  naproxen (NAPROSYN) 500 MG tablet Take 1 tablet (500 mg total) by mouth 2 (two) times daily with a meal. 05/27/18   Geoffery Lyons, MD  naproxen (NAPROSYN) 500 MG tablet Take 1 tablet (500 mg total) by mouth 2 (two) times daily. 03/25/19   Ankit Degregorio, Mayer Masker, MD    Allergies    Penicillins  Review of Systems   Review of Systems  Constitutional: Negative for fever.  HENT: Positive for dental problem. Negative for trouble swallowing.   All other systems reviewed and are negative.   Physical Exam Updated Vital Signs BP (!) 114/54   Pulse 99   Temp 97.9 F (36.6 C)   Resp 18   Ht 1.651 m (5\' 5" )   Wt 85.3 kg   LMP 03/22/2019 (Approximate)   SpO2 98%   BMI 31.28 kg/m  Physical Exam Vitals and nursing note reviewed.  Constitutional:      Appearance: She is well-developed. She is not ill-appearing.  HENT:     Head: Normocephalic and atraumatic.     Nose: Nose normal.     Mouth/Throat:     Mouth: Mucous membranes are moist.     Comments: No trauma trismus, no fullness noted under the tongue, tenderness to palpation over tooth 14 with dental caries noted, no obvious abscess, there is significant exposure of tooth 15 with significant regression of the gumline, overall poor dentition with multiple dental caries Eyes:     Pupils: Pupils are equal, round, and reactive to light.  Cardiovascular:     Rate and Rhythm: Normal rate and regular rhythm.  Pulmonary:     Effort: Pulmonary effort is normal. No respiratory distress.  Musculoskeletal:     Cervical back: Neck supple.  Skin:    General: Skin is warm and dry.  Neurological:     Mental Status: She is alert and oriented to person, place, and time.     Deep Tendon Reflexes: Abnormal reflex:   Psychiatric:        Mood and Affect: Mood normal.     ED Results / Procedures / Treatments   Labs (all labs ordered are  listed, but only abnormal results are displayed) Labs Reviewed - No data to display  EKG None  Radiology No results found.  Procedures Procedures (including critical care time)  Medications Ordered in ED Medications  ketorolac (TORADOL) 30 MG/ML injection 30 mg (30 mg Intramuscular Given 03/25/19 0108)    ED Course  I have reviewed the triage vital signs and the nursing notes.  Pertinent labs & imaging results that were available during my care of the patient were reviewed by me and considered in my medical decision making (see chart for details).    MDM Rules/Calculators/A&P                       Patient presents with dental pain.  She is overall nontoxic and vital signs are reassuring.  She has no obvious drainable abscess but has tenderness to palpation adjacent to a decayed tooth as well as a significantly exposed molar secondary to gum regression.  Will place on antibiotics and have her follow-up with dentist.  She was provided with dental resources.  Recommend naproxen twice daily for pain.  After history, exam, and medical workup I feel the patient has been appropriately medically screened and is safe for discharge home. Pertinent diagnoses were discussed with the patient. Patient was given return precautions.    Final Clinical Impression(s) / ED Diagnoses Final diagnoses:  Pain, dental    Rx / DC Orders ED Discharge Orders         Ordered    naproxen (NAPROSYN) 500 MG tablet  2 times daily     03/25/19 0107    clindamycin (CLEOCIN) 150 MG capsule  4 times daily     03/25/19 0107           Retina Bernardy, Barbette Hair, MD 03/25/19 [phone]

## 2019-03-25 NOTE — Discharge Instructions (Addendum)
You were seen today for dental pain.  Take antibiotics as prescribed.  Follow-up with dentistry for formal evaluation and likely teeth extraction.

## 2019-03-25 NOTE — ED Triage Notes (Signed)
Left sided dental pain. States stabbing/aching pain. BC powder not effective. Onset 1 day ago.

## 2019-05-13 ENCOUNTER — Encounter (HOSPITAL_BASED_OUTPATIENT_CLINIC_OR_DEPARTMENT_OTHER): Payer: Self-pay | Admitting: *Deleted

## 2019-05-13 ENCOUNTER — Emergency Department (HOSPITAL_BASED_OUTPATIENT_CLINIC_OR_DEPARTMENT_OTHER)
Admission: EM | Admit: 2019-05-13 | Discharge: 2019-05-13 | Disposition: A | Discharge status: Home / Self Care | Payer: Self-pay | Attending: Emergency Medicine | Admitting: Emergency Medicine

## 2019-05-13 ENCOUNTER — Other Ambulatory Visit: Payer: Self-pay

## 2019-05-13 DIAGNOSIS — Z5321 Procedure and treatment not carried out due to patient leaving prior to being seen by health care provider: Secondary | ICD-10-CM | POA: Insufficient documentation

## 2019-05-13 DIAGNOSIS — R0602 Shortness of breath: Secondary | ICD-10-CM | POA: Insufficient documentation

## 2019-05-13 NOTE — ED Triage Notes (Signed)
Pt states that one hour after eating pizza she was talking with her boyfriend and started feeling sob. Hx of asthma and did use her inhaler pta. States she did not feel like the inhaler was helping. States she feels like there is a lump in her throat. States she does feel a little anxious. No distress at present. resp even and unlabored.

## 2019-10-22 IMAGING — DX DG CHEST 2V
2 series · 2 of 2 positions shown · non-contrast
Comparison: 10/19/2017.

CLINICAL DATA: Chest pain and pressure and shortness of breath for
the past 2 days. Smoker.

EXAM:
CHEST - 2 VIEW

[chest pa]
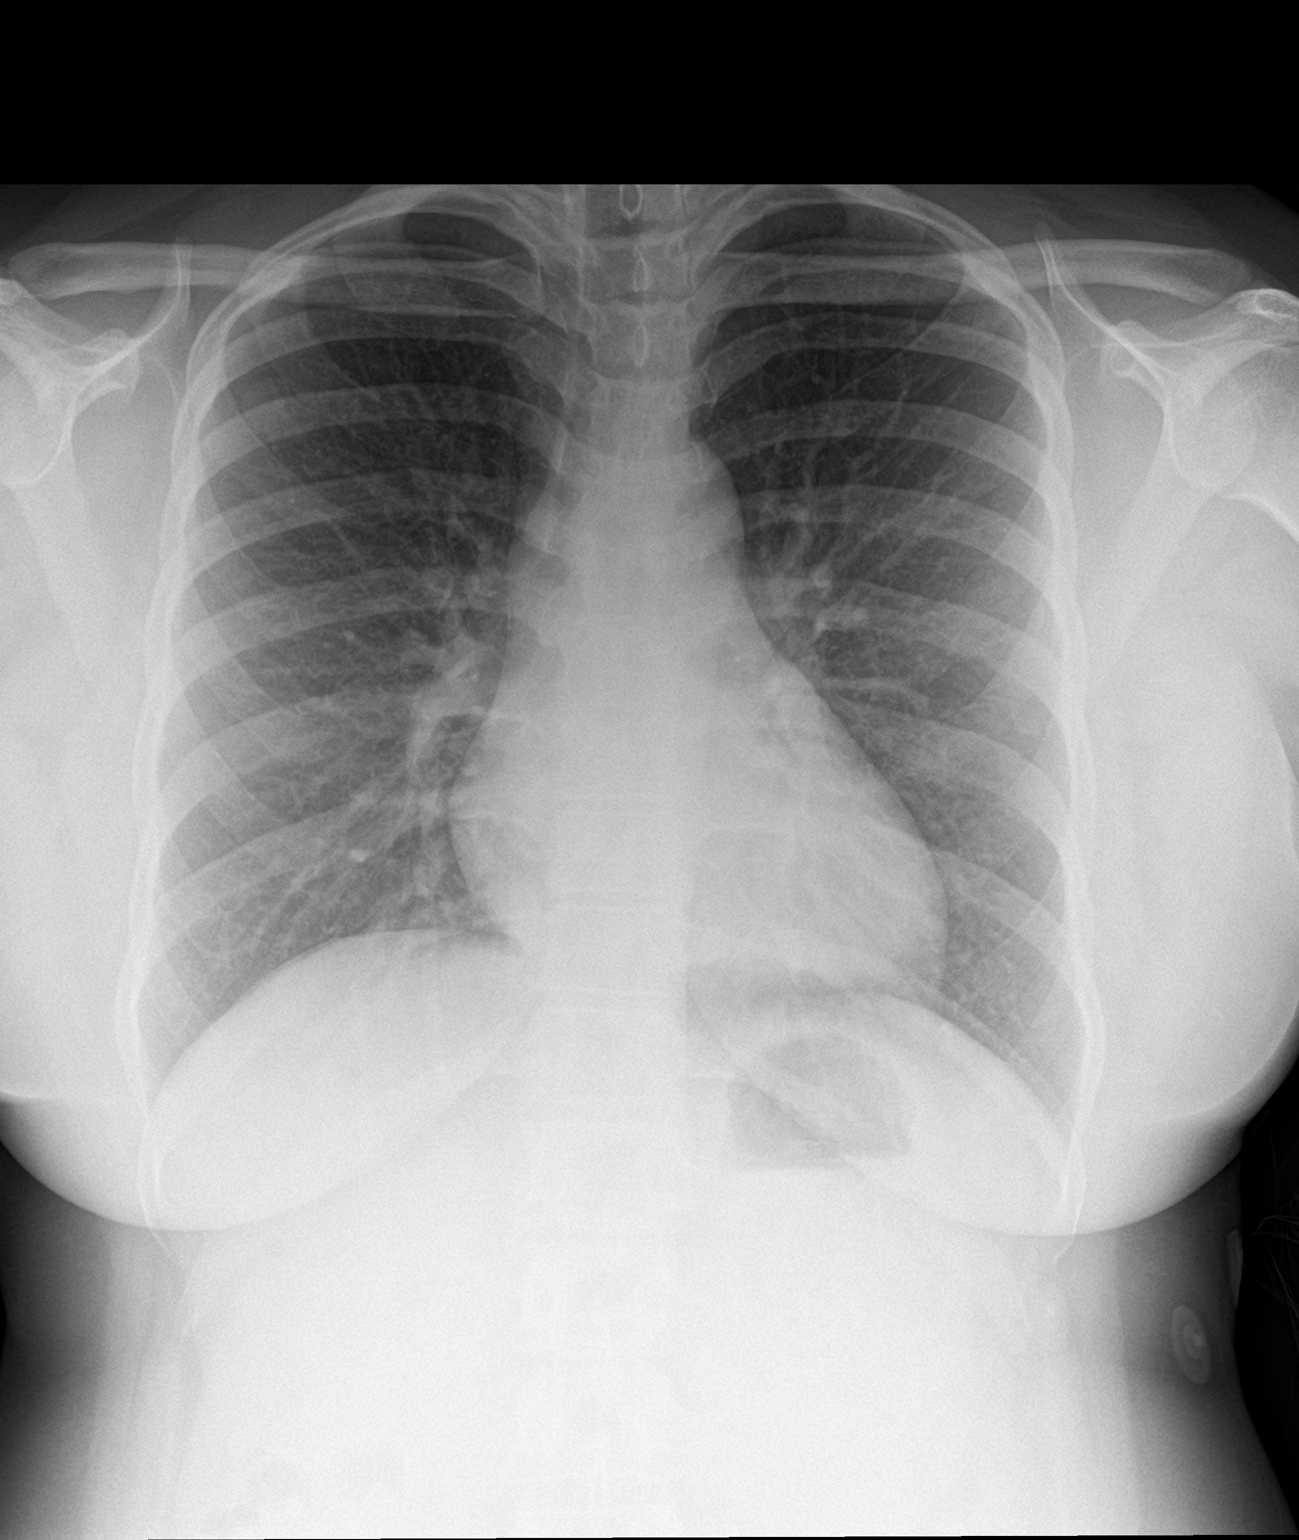

[chest lat]
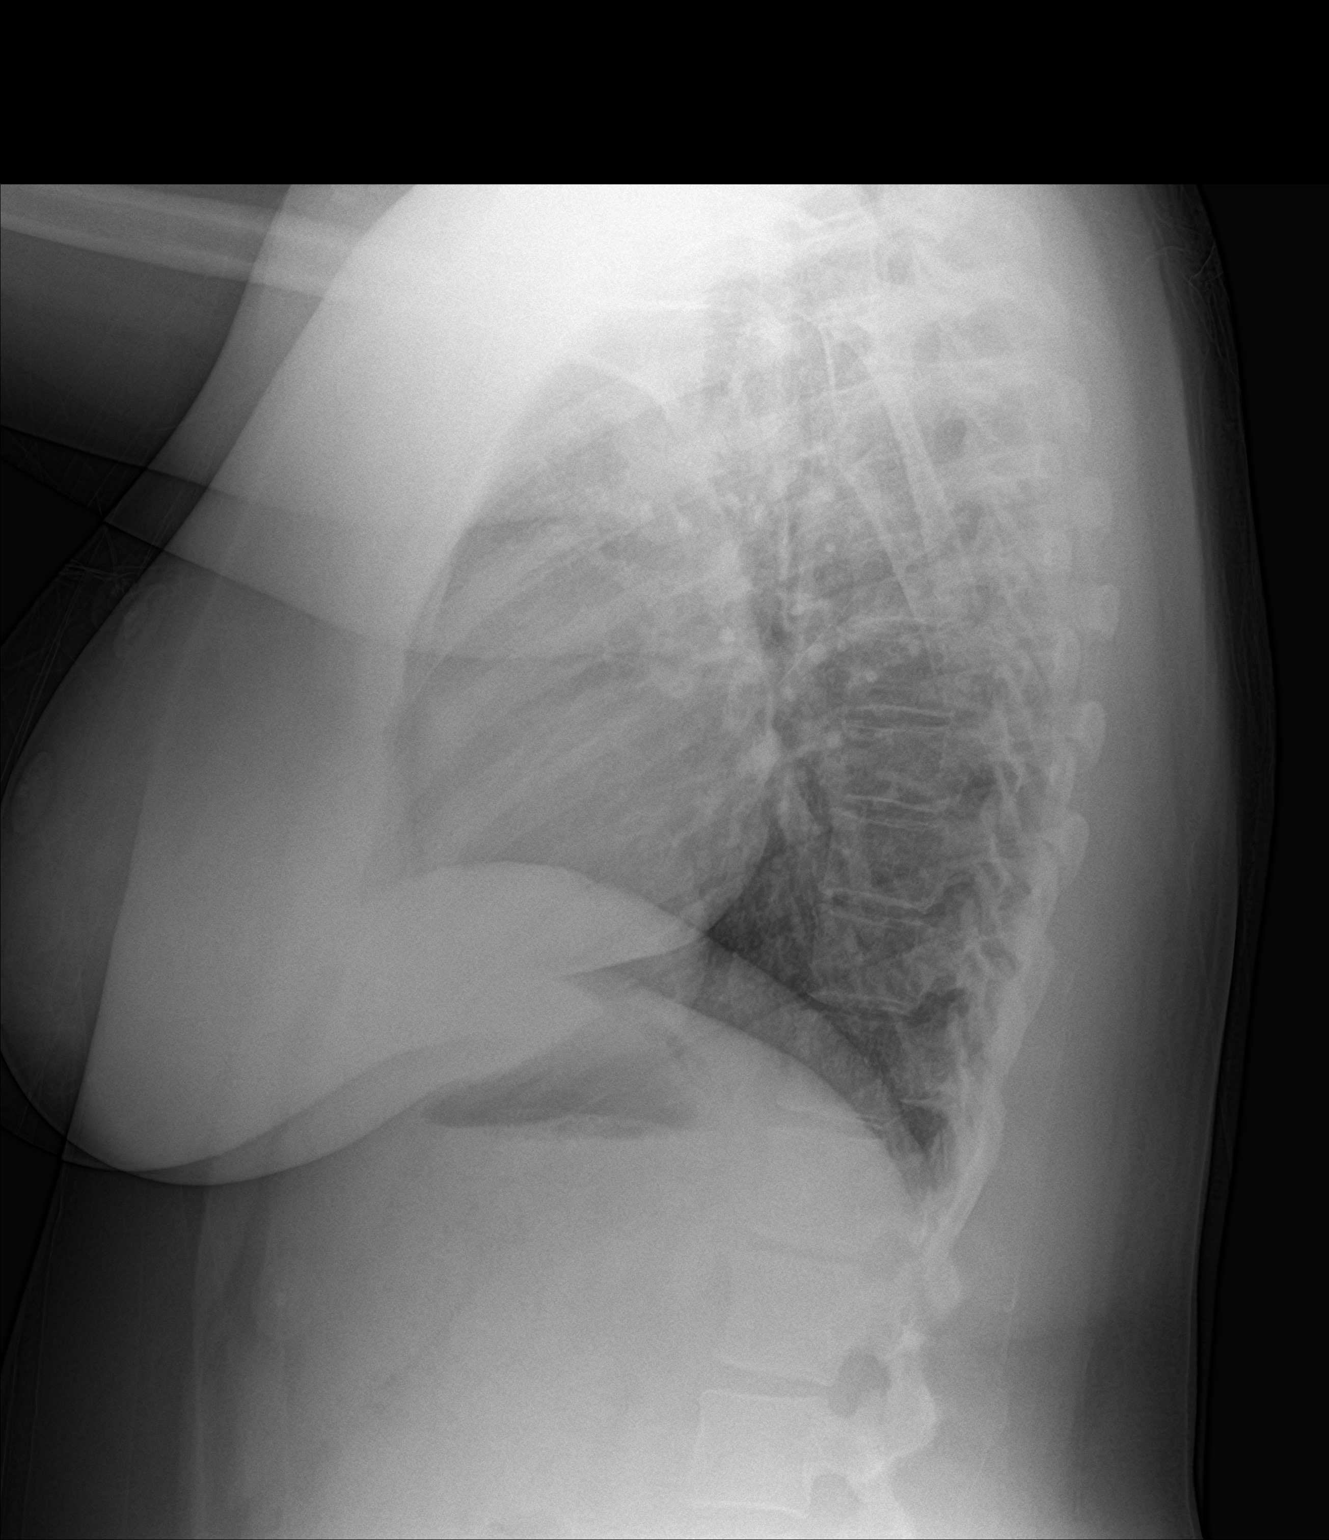

[2 of 2 positions shown; findings below may reference images not displayed]

FINDINGS: Normal sized heart. Clear lungs. Stable mildly prominent
interstitial markings. Normal appearing bones.
IMPRESSION: No acute abnormality. Stable mild chronic interstitial lung disease
compatible with the history of smoking.

## 2020-04-08 ENCOUNTER — Other Ambulatory Visit: Payer: Self-pay

## 2020-04-08 ENCOUNTER — Other Ambulatory Visit (HOSPITAL_COMMUNITY): Payer: Self-pay | Admitting: Emergency Medicine

## 2020-04-08 ENCOUNTER — Emergency Department (HOSPITAL_BASED_OUTPATIENT_CLINIC_OR_DEPARTMENT_OTHER)
Admission: EM | Admit: 2020-04-08 | Discharge: 2020-04-08 | Disposition: A | Discharge status: Home / Self Care | Payer: Self-pay | Attending: Emergency Medicine | Admitting: Emergency Medicine

## 2020-04-08 ENCOUNTER — Encounter (HOSPITAL_BASED_OUTPATIENT_CLINIC_OR_DEPARTMENT_OTHER): Payer: Self-pay | Admitting: Emergency Medicine

## 2020-04-08 DIAGNOSIS — M5430 Sciatica, unspecified side: Secondary | ICD-10-CM

## 2020-04-08 DIAGNOSIS — F17210 Nicotine dependence, cigarettes, uncomplicated: Secondary | ICD-10-CM | POA: Insufficient documentation

## 2020-04-08 DIAGNOSIS — N760 Acute vaginitis: Secondary | ICD-10-CM | POA: Insufficient documentation

## 2020-04-08 DIAGNOSIS — M5442 Lumbago with sciatica, left side: Secondary | ICD-10-CM | POA: Insufficient documentation

## 2020-04-08 DIAGNOSIS — B9689 Other specified bacterial agents as the cause of diseases classified elsewhere: Secondary | ICD-10-CM | POA: Insufficient documentation

## 2020-04-08 LAB — WET PREP, GENITAL
Sperm: NONE SEEN
Trich, Wet Prep: NONE SEEN
Yeast Wet Prep HPF POC: NONE SEEN

## 2020-04-08 LAB — PREGNANCY, URINE: Preg Test, Ur: NEGATIVE

## 2020-04-08 MED ORDER — CYCLOBENZAPRINE HCL 10 MG PO TABS: 10.0000 mg | ORAL_TABLET | Freq: Two times a day (BID) | ORAL | 0 refills | Status: DC | PRN

## 2020-04-08 MED ORDER — KETOROLAC TROMETHAMINE 60 MG/2ML IM SOLN
60.0000 mg | Freq: Once | INTRAMUSCULAR | Status: AC
  Administered 2020-04-08: 60 mg via INTRAMUSCULAR
  Filled 2020-04-08: qty 2

## 2020-04-08 MED ORDER — METRONIDAZOLE 500 MG PO TABS: 500.0000 mg | ORAL_TABLET | Freq: Two times a day (BID) | ORAL | 0 refills | Status: AC

## 2020-04-08 MED ORDER — METHYLPREDNISOLONE 4 MG PO TBPK: ORAL_TABLET | ORAL | 0 refills | Status: DC

## 2020-04-08 MED FILL — methylPREDNISolone 4 MG dos: 4 | 6 days supply | Qty: 21 | Fill #0

## 2020-04-08 MED FILL — METRONIDAZOLE 500 MG TABS: 500 | 7 days supply | Qty: 14 | Fill #0

## 2020-04-08 MED FILL — CYCLOBENZAPRINE HCL 10 MG T: 10 | 10 days supply | Qty: 20 | Fill #0

## 2020-04-08 NOTE — ED Provider Notes (Signed)
[facility] EMERGENCY DEPARTMENT Provider Note   CSN: [id number] Arrival date & time: 04/08/20  2947     History Chief Complaint  Patient presents with  . Sciatica    Regina Rojas is a 30 y.o. female.  The history is provided by the patient.  Back Pain Location:  Lumbar spine and gluteal region Quality:  Aching Radiates to:  L foot Pain severity:  Mild Onset quality:  Gradual Timing:  Intermittent Progression:  Waxing and waning Chronicity:  New Context: physical stress   Relieved by:  Nothing Worsened by:  Movement Associated symptoms: no abdominal pain, no abdominal swelling, no bladder incontinence, no bowel incontinence, no chest pain, no fever, no headaches, no leg pain, no numbness, no pelvic pain, no perianal numbness, no tingling, no weakness and no weight loss   Associated symptoms comment:  White vaginal discharge as well       Past Medical History:  Diagnosis Date  . Anemia   . Bipolar disorder (HCC)   . Cervical dysplasia   . Depression   . Sickle cell trait [facility])     Patient Active Problem List   Diagnosis Date Noted  . Negative pregnancy test 07/28/2018  . Sickle cell trait (HCC)   . Depression   . Bipolar disorder (HCC)   . Anemia   . Cervical dysplasia     Past Surgical History:  Procedure Laterality Date  . COLPOSCOPY    . GYNECOLOGIC CRYOSURGERY       OB History    Gravida  3   Para      Term      Preterm      AB  2   Living  0     SAB  2   IAB      Ectopic      Multiple      Live Births              Family History  Problem Relation Age of Onset  . Diabetes Mother   . Hypertension Mother   . Hypertension Sister   . Heart disease Maternal Grandmother     Social History   Tobacco Use  . Smoking status: Current Every Day Smoker    Packs/day: 1.00    Types: Cigarettes  . Smokeless tobacco: Never Used  Vaping Use  . Vaping Use: Never used  Substance Use Topics  . Alcohol use: Yes     Comment: occ  . Drug use: Yes    Types: Marijuana    Home Medications Prior to Admission medications   Medication Sig Start Date End Date Taking? Authorizing Provider  cyclobenzaprine (FLEXERIL) 10 MG tablet Take 1 tablet (10 mg total) by mouth 2 (two) times daily as needed for muscle spasms. 04/08/20  Yes Jacqualynn Parco, DO  methylPREDNISolone (MEDROL DOSEPAK) 4 MG TBPK tablet Follow package insert 04/08/20  Yes Jaskarn Schweer, DO  metroNIDAZOLE (FLAGYL) 500 MG tablet Take 1 tablet (500 mg total) by mouth 2 (two) times daily for 7 days. 04/08/20 04/15/20 Yes Isauro Skelley, DO  albuterol (VENTOLIN HFA) 108 (90 Base) MCG/ACT inhaler Inhale into the lungs every 6 (six) hours as needed for wheezing or shortness of breath.    [provider]  clindamycin (CLEOCIN) 150 MG capsule Take 1 capsule (150 mg total) by mouth 4 (four) times daily. 03/25/19   Horton, Mayer Masker, MD  ibuprofen (ADVIL,MOTRIN) 800 MG tablet Take 1 tablet (800 mg total) by mouth every 6 (six)  hours as needed for moderate pain. 03/14/18   Gilda Crease, MD  naproxen (NAPROSYN) 500 MG tablet Take 1 tablet (500 mg total) by mouth 2 (two) times daily with a meal. 05/27/18   Geoffery Lyons, MD  naproxen (NAPROSYN) 500 MG tablet Take 1 tablet (500 mg total) by mouth 2 (two) times daily. 03/25/19   Horton, Mayer Masker, MD    Allergies    Penicillins  Review of Systems   Review of Systems  Constitutional: Negative for chills, fever and weight loss.  HENT: Negative for ear pain and sore throat.   Eyes: Negative for pain and visual disturbance.  Respiratory: Negative for cough and shortness of breath.   Cardiovascular: Negative for chest pain and palpitations.  Gastrointestinal: Negative for abdominal pain, bowel incontinence and vomiting.  Genitourinary: Positive for vaginal discharge. Negative for bladder incontinence, decreased urine volume, difficulty urinating, dyspareunia, enuresis, genital sores, hematuria, menstrual  problem, pelvic pain and vaginal pain.  Musculoskeletal: Positive for back pain. Negative for arthralgias, gait problem, joint swelling, myalgias, neck pain and neck stiffness.  Skin: Negative for color change and rash.  Neurological: Negative for tingling, seizures, syncope, weakness, numbness and headaches.  All other systems reviewed and are negative.   Physical Exam Updated Vital Signs BP (!) 127/101 (BP Location: Right Arm)   Pulse 72   Temp 97.8 F (36.6 C) (Oral)   Resp 18   Ht 5\' 5"  (1.651 m)   Wt 84.8 kg   SpO2 97%   BMI 31.12 kg/m   Physical Exam Vitals and nursing note reviewed.  Constitutional:      General: She is not in acute distress.    Appearance: She is well-developed. She is not ill-appearing.  HENT:     Head: Normocephalic and atraumatic.     Nose: Nose normal.     Mouth/Throat:     Mouth: Mucous membranes are moist.  Eyes:     Conjunctiva/sclera: Conjunctivae normal.     Pupils: Pupils are equal, round, and reactive to light.  Cardiovascular:     Rate and Rhythm: Normal rate and regular rhythm.     Pulses: Normal pulses.     Heart sounds: Normal heart sounds. No murmur heard.   Pulmonary:     Effort: Pulmonary effort is normal. No respiratory distress.     Breath sounds: Normal breath sounds.  Abdominal:     Palpations: Abdomen is soft.     Tenderness: There is no abdominal tenderness.  Musculoskeletal:        General: Tenderness present. Normal range of motion.     Cervical back: Normal range of motion and neck supple.     Comments: Tenderness in the left sided paraspinal muscles of the lumbar area as well as left gluteal tenderness  Skin:    General: Skin is warm and dry.     Capillary Refill: Capillary refill takes less than 2 seconds.  Neurological:     General: No focal deficit present.     Mental Status: She is alert and oriented to person, place, and time.     Sensory: No sensory deficit.     Motor: No weakness.     Comments: 5+ out  of 5 strength throughout, normal sensation     ED Results / Procedures / Treatments   Labs (all labs ordered are listed, but only abnormal results are displayed) Labs Reviewed  WET PREP, GENITAL - Abnormal; Notable for the following components:      Result  Value   Clue Cells Wet Prep HPF POC PRESENT (*)    WBC, Wet Prep HPF POC FEW (*)    All other components within normal limits  PREGNANCY, URINE  GC/CHLAMYDIA PROBE AMP ([place]) NOT AT [facility]    EKG None  Radiology No results found.  Procedures Procedures   Medications Ordered in ED Medications  ketorolac (TORADOL) injection 60 mg (has no administration in time range)    ED Course  I have reviewed the triage vital signs and the nursing notes.  Pertinent labs & imaging results that were available during my care of the patient were reviewed by me and considered in my medical decision making (see chart for details).    MDM Rules/Calculators/A&P                          Rosemarie R Bracamonte is here with back pain and vaginal discharge.  Normal vitals.  No fever.  No symptoms suggestive of cauda equina.  Neurologically neurovascularly intact on exam.  Tenderness to the left gluteus as well as left paraspinal lumbar muscles consistent with muscle spasm, sciatica.  Pregnancy test negative.  Will treat with Toradol, Flexeril, Medrol Dosepak.  We will have her follow-up with primary care doctor.  She is also having vaginal discharge.  She is not having any pain.  She prefers self swab.  Was positive for bacterial vaginosis.  Will treat with Flagyl.  Discharged in good condition.  Understands return precautions.  Marland Kitchenadcdic   Final Clinical Impression(s) / ED Diagnoses Final diagnoses:  Bacterial vaginosis  Sciatica, unspecified laterality    Rx / DC Orders ED Discharge Orders         Ordered    metroNIDAZOLE (FLAGYL) 500 MG tablet  2 times daily        04/08/20 1015    cyclobenzaprine (FLEXERIL) 10 MG tablet  2 times daily  PRN        04/08/20 1015    methylPREDNISolone (MEDROL DOSEPAK) 4 MG TBPK tablet        04/08/20 1015           Cheyeanne Roadcap, DO 04/08/20 1017

## 2020-04-08 NOTE — ED Triage Notes (Signed)
Reports pain above left buttock that radiates down the buttock and leg.  Also c./o vaginal itching with white thick discharge.  Has not used otc meds.

## 2020-04-08 NOTE — Discharge Instructions (Signed)
Take Flagyl for bacterial vaginosis.  Take muscle relaxant as needed for pain.  Take Medrol Dosepak for back pain as well as take 600 mg of Motrin every 8 hours as needed for pain

## 2020-04-09 LAB — GC/CHLAMYDIA PROBE AMP (~~LOC~~) NOT AT ARMC
Chlamydia: NEGATIVE
Comment: NEGATIVE
Comment: NORMAL
Neisseria Gonorrhea: NEGATIVE

## 2020-04-14 ENCOUNTER — Emergency Department (HOSPITAL_COMMUNITY)
Admission: EM | Admit: 2020-04-14 | Discharge: 2020-04-14 | Disposition: A | Discharge status: Home / Self Care | Payer: Self-pay | Attending: Emergency Medicine | Admitting: Emergency Medicine

## 2020-04-14 ENCOUNTER — Other Ambulatory Visit: Payer: Self-pay

## 2020-04-14 ENCOUNTER — Encounter (HOSPITAL_COMMUNITY): Payer: Self-pay

## 2020-04-14 DIAGNOSIS — N888 Other specified noninflammatory disorders of cervix uteri: Secondary | ICD-10-CM | POA: Insufficient documentation

## 2020-04-14 DIAGNOSIS — N898 Other specified noninflammatory disorders of vagina: Secondary | ICD-10-CM | POA: Insufficient documentation

## 2020-04-14 DIAGNOSIS — F17210 Nicotine dependence, cigarettes, uncomplicated: Secondary | ICD-10-CM | POA: Insufficient documentation

## 2020-04-14 LAB — URINALYSIS, ROUTINE W REFLEX MICROSCOPIC
Bilirubin Urine: NEGATIVE
Glucose, UA: NEGATIVE mg/dL
Hgb urine dipstick: NEGATIVE
Ketones, ur: NEGATIVE mg/dL
Nitrite: NEGATIVE
Protein, ur: NEGATIVE mg/dL
Specific Gravity, Urine: 1.019 (ref 1.005–1.030)
pH: 7 (ref 5.0–8.0)

## 2020-04-14 LAB — WET PREP, GENITAL
Clue Cells Wet Prep HPF POC: NONE SEEN
Sperm: NONE SEEN
Trich, Wet Prep: NONE SEEN
Yeast Wet Prep HPF POC: NONE SEEN

## 2020-04-14 MED ORDER — FLUCONAZOLE 150 MG PO TABS: 150.0000 mg | ORAL_TABLET | Freq: Every day | ORAL | 0 refills | Status: AC

## 2020-04-14 NOTE — ED Triage Notes (Signed)
Pt presents from home, c/o vaginal itching and burning x 2-3 days. Recently treated for BV

## 2020-04-14 NOTE — ED Provider Notes (Signed)
[facility]-EMERGENCY DEPT Provider Note   CSN: [id number] Arrival date & time: 04/14/20  2140     History Chief Complaint  Patient presents with  . Vaginal Itching    Regina Rojas is a 30 y.o. female.  30 y.o female with a PMH of Bipolar, Depression presents to the ED with a chief complaint of vaginal itching for the past 3 days.  Patient reports she was recently treated for bacterial vaginosis with Flagyl, and states that she is unsure whether this caused a yeast infection.  She does not have any prior history of yeast infections, however is complaining of a thick white vaginal discharge.  She also endorses vaginal itching.  She is currently sexually active but has not been in the past month.  She was previously seen for low back pain along with BV.  She has tried Monistat for the past 2 days without any improvement in her symptoms. She denies any abdominal pain, nausea, vomiting, urinary symptoms.    The history is provided by the patient.       Past Medical History:  Diagnosis Date  . Anemia   . Bipolar disorder (HCC)   . Cervical dysplasia   . Depression   . Sickle cell trait [facility])     Patient Active Problem List   Diagnosis Date Noted  . Negative pregnancy test 07/28/2018  . Sickle cell trait (HCC)   . Depression   . Bipolar disorder (HCC)   . Anemia   . Cervical dysplasia     Past Surgical History:  Procedure Laterality Date  . COLPOSCOPY    . GYNECOLOGIC CRYOSURGERY       OB History    Gravida  3   Para      Term      Preterm      AB  2   Living  0     SAB  2   IAB      Ectopic      Multiple      Live Births              Family History  Problem Relation Age of Onset  . Diabetes Mother   . Hypertension Mother   . Hypertension Sister   . Heart disease Maternal Grandmother     Social History   Tobacco Use  . Smoking status: Current Every Day Smoker    Packs/day: 1.00    Types: Cigarettes  .  Smokeless tobacco: Never Used  Vaping Use  . Vaping Use: Never used  Substance Use Topics  . Alcohol use: Yes    Comment: occ  . Drug use: Yes    Types: Marijuana    Home Medications Prior to Admission medications   Medication Sig Start Date End Date Taking? Authorizing Provider  albuterol (VENTOLIN HFA) 108 (90 Base) MCG/ACT inhaler Inhale into the lungs every 6 (six) hours as needed for wheezing or shortness of breath.   Yes [provider]  cyclobenzaprine (FLEXERIL) 10 MG tablet Take 1 tablet (10 mg total) by mouth 2 (two) times daily as needed for muscle spasms. 04/08/20  Yes Curatolo, Adam, DO  fluconazole (DIFLUCAN) 150 MG tablet Take 1 tablet (150 mg total) by mouth daily for 1 dose. 04/14/20 04/15/20 Yes Mikayla Chiusano, PA-C  clindamycin (CLEOCIN) 150 MG capsule Take 1 capsule (150 mg total) by mouth 4 (four) times daily. Patient not taking: Reported on 04/14/2020 03/25/19   Horton, Mayer Masker, MD  ibuprofen (  ADVIL,MOTRIN) 800 MG tablet Take 1 tablet (800 mg total) by mouth every 6 (six) hours as needed for moderate pain. Patient not taking: Reported on 04/14/2020 03/14/18   Gilda Crease, MD  methylPREDNISolone (MEDROL DOSEPAK) 4 MG TBPK tablet Follow package insert Patient not taking: No sig reported 04/08/20   Virgina Norfolk, DO  metroNIDAZOLE (FLAGYL) 500 MG tablet Take 1 tablet (500 mg total) by mouth 2 (two) times daily for 7 days. 04/08/20 04/15/20  Curatolo, Adam, DO  naproxen (NAPROSYN) 500 MG tablet Take 1 tablet (500 mg total) by mouth 2 (two) times daily with a meal. Patient not taking: Reported on 04/14/2020 05/27/18   Geoffery Lyons, MD  naproxen (NAPROSYN) 500 MG tablet Take 1 tablet (500 mg total) by mouth 2 (two) times daily. Patient not taking: Reported on 04/14/2020 03/25/19   Horton, Mayer Masker, MD    Allergies    Penicillins  Review of Systems   Review of Systems  Constitutional: Negative for fever.  Gastrointestinal: Negative for abdominal pain, nausea  and vomiting.  Genitourinary: Positive for dysuria and vaginal discharge. Negative for decreased urine volume, difficulty urinating, pelvic pain and vaginal bleeding.  All other systems reviewed and are negative.   Physical Exam Updated Vital Signs BP (!) 143/106 (BP Location: Right Arm)   Pulse 80   Temp 98.4 F (36.9 C) (Oral)   Resp 16   Ht 5\' 5"  (1.651 m)   Wt 85.5 kg   SpO2 100%   BMI 31.38 kg/m   Physical Exam Vitals and nursing note reviewed. Exam conducted with a chaperone present.  Constitutional:      Appearance: Normal appearance.  HENT:     Head: Normocephalic and atraumatic.     Mouth/Throat:     Mouth: Mucous membranes are moist.  Cardiovascular:     Rate and Rhythm: Normal rate.  Pulmonary:     Effort: Pulmonary effort is normal.  Abdominal:     General: Abdomen is flat.  Genitourinary:    Exam position: Supine.     Pubic Area: No rash or pubic lice.      Labia:        Right: No rash or tenderness.        Left: No rash or tenderness.      Vagina: Vaginal discharge present. No erythema, tenderness, bleeding or lesions.     Cervix: Discharge present. No erythema.     Adnexa:        Right: No tenderness or fullness.         Left: No tenderness or fullness.       Comments: Chaperone by RN Taken amount of thick white vaginal discharge on vaginal vault. Musculoskeletal:     Cervical back: Normal range of motion and neck supple.  Skin:    General: Skin is warm and dry.  Neurological:     Mental Status: She is alert and oriented to person, place, and time.     ED Results / Procedures / Treatments   Labs (all labs ordered are listed, but only abnormal results are displayed) Labs Reviewed  WET PREP, GENITAL - Abnormal; Notable for the following components:      Result Value   WBC, Wet Prep HPF POC MANY (*)    All other components within normal limits  URINALYSIS, ROUTINE W REFLEX MICROSCOPIC - Abnormal; Notable for the following components:    APPearance HAZY (*)    Leukocytes,Ua MODERATE (*)    Bacteria, UA FEW (*)  All other components within normal limits  URINE CULTURE  GC/CHLAMYDIA PROBE AMP () NOT AT [facility]    EKG None  Radiology No results found.  Procedures Procedures   Medications Ordered in ED Medications - No data to display  ED Course  I have reviewed the triage vital signs and the nursing notes.  Pertinent labs & imaging results that were available during my care of the patient were reviewed by me and considered in my medical decision making (see chart for details).  Clinical Course as of 04/14/20 2321  Tue Apr 14, 2020  2255 Glori Luis): MODERATE [JS]  2255 Bacteria, UA(!): FEW [JS]  2256 WBC, UA: 6-10 [JS]  2301 WBC, Wet Prep HPF POC(!): MANY [JS]    Clinical Course User Index [JS] Claude Manges, PA-C   MDM Rules/Calculators/A&P   Patient here with 3 days of vaginal itching along with thick white vaginal discharge.  Reports discharge began after starting antibiotics for BV.  She does report discomfort, has tried Monistat over-the-counter without improvement in symptoms.  She is currently sexually active but has not been since last month.  Denies any abdominal pain, fever, urinary symptoms.  Does report severe discomfort with urination, like a burning sensation to the vaginal wall.  Chaperone by Morrie Sheldon nurse tech, pelvic exam performed by me with significant amount of thick white discharge on the vaginal vault, cervix is nonerythematous and without any lesions.  No external lesions or vesicles noted.  Wet prep and along with GC obtained.  From her last visit GC was negative.  UA is also pending. Wet prep remarkable for many white blood cells.  No yeast pressing although physical exam highly suggestive of this.  UA with moderate leukocytes, few bacteria, 6-10 white blood cell count.  However specimen likely contaminated.  We discussed treatment with Diflucan, will receive 1 dose  today without any repeating doses, she will continue her Monistat at home.  She was also advised to finish Flagyl therapy at this time.  Vitals are within normal limits, patient is overall well-appearing, she stable for discharge.   Portions of this note were generated with Scientist, clinical (histocompatibility and immunogenetics). Dictation errors may occur despite best attempts at proofreading.  Final Clinical Impression(s) / ED Diagnoses Final diagnoses:  Vaginal itching    Rx / DC Orders ED Discharge Orders         Ordered    fluconazole (DIFLUCAN) 150 MG tablet  Daily        04/14/20 2308           Claude Manges, PA-C 04/14/20 2321    Rolan Bucco, MD 04/17/20 1506

## 2020-04-14 NOTE — Discharge Instructions (Addendum)
Your UA showed some bacteria, this was sent for culture. If your urine culture is positive for an organism you will be called in order to be placed on antibiotics.  I have provided a prescription for Diflucan, we discussed risks and benefits of taking this medication.  You will take 1 dose tomorrow, continue with over-the-counter Monistat.  You experience any fever, abdominal pain, worsening symptoms please return to the emergency department.

## 2020-04-15 LAB — GC/CHLAMYDIA PROBE AMP (~~LOC~~) NOT AT ARMC
Chlamydia: NEGATIVE
Comment: NEGATIVE
Comment: NORMAL
Neisseria Gonorrhea: NEGATIVE

## 2020-04-16 LAB — URINE CULTURE

## 2020-06-23 ENCOUNTER — Emergency Department (HOSPITAL_BASED_OUTPATIENT_CLINIC_OR_DEPARTMENT_OTHER)
Admission: EM | Admit: 2020-06-23 | Discharge: 2020-06-23 | Disposition: A | Discharge status: Home / Self Care | Payer: Self-pay | Attending: Emergency Medicine | Admitting: Emergency Medicine

## 2020-06-23 ENCOUNTER — Encounter (HOSPITAL_BASED_OUTPATIENT_CLINIC_OR_DEPARTMENT_OTHER): Payer: Self-pay | Admitting: Emergency Medicine

## 2020-06-23 ENCOUNTER — Other Ambulatory Visit: Payer: Self-pay

## 2020-06-23 DIAGNOSIS — N390 Urinary tract infection, site not specified: Secondary | ICD-10-CM | POA: Insufficient documentation

## 2020-06-23 DIAGNOSIS — F17210 Nicotine dependence, cigarettes, uncomplicated: Secondary | ICD-10-CM | POA: Insufficient documentation

## 2020-06-23 LAB — URINALYSIS, MICROSCOPIC (REFLEX): RBC / HPF: >50 RBC/hpf (ref 0–5)

## 2020-06-23 LAB — URINALYSIS, ROUTINE W REFLEX MICROSCOPIC
Glucose, UA: NEGATIVE mg/dL
Ketones, ur: 40 mg/dL — AB
Leukocytes,Ua: NEGATIVE
Nitrite: NEGATIVE
Protein, ur: 100 mg/dL — AB
Specific Gravity, Urine: >1.03 — ABNORMAL HIGH (ref 1.005–1.030)
pH: 5 (ref 5.0–8.0)

## 2020-06-23 LAB — PREGNANCY, URINE: Preg Test, Ur: NEGATIVE

## 2020-06-23 MED ORDER — CIPROFLOXACIN HCL 500 MG PO TABS: 500.0000 mg | ORAL_TABLET | Freq: Two times a day (BID) | ORAL | 0 refills | Status: DC

## 2020-06-23 MED ORDER — CIPROFLOXACIN HCL 500 MG PO TABS
500.0000 mg | ORAL_TABLET | Freq: Once | ORAL | Status: AC
  Administered 2020-06-23: 500 mg via ORAL
  Filled 2020-06-23: qty 1

## 2020-06-23 NOTE — ED Triage Notes (Signed)
Pt states over the weekend she felt like she was getting a UTI  Pt states she had left flank pain over the weekend  States now she has pain at the end of urination and she is having the frequency

## 2020-06-23 NOTE — ED Provider Notes (Signed)
[facility] EMERGENCY DEPARTMENT Provider Note   CSN: [id number] Arrival date & time: 06/23/20  0342     History Chief Complaint  Patient presents with   Urinary Tract Infection    Regina Rojas is a 30 y.o. female.   Dysuria Pain quality:  Sharp and aching Pain severity:  Mild Timing:  Constant Chronicity:  New Recent urinary tract infections: yes   Relieved by:  None tried Worsened by:  Nothing Ineffective treatments:  None tried Urinary symptoms: no discolored urine   Associated symptoms: no abdominal pain   Risk factors: no hx of pyelonephritis, no renal disease and not sexually active       Past Medical History:  Diagnosis Date   Anemia    Bipolar disorder (HCC)    Cervical dysplasia    Depression    Sickle cell trait (HCC)     Patient Active Problem List   Diagnosis Date Noted   Negative pregnancy test 07/28/2018   Sickle cell trait (HCC)    Depression    Bipolar disorder (HCC)    Anemia    Cervical dysplasia     Past Surgical History:  Procedure Laterality Date   COLPOSCOPY     GYNECOLOGIC CRYOSURGERY       OB History     Gravida  3   Para      Term      Preterm      AB  2   Living  0      SAB  2   IAB      Ectopic      Multiple      Live Births              Family History  Problem Relation Age of Onset   Diabetes Mother    Hypertension Mother    Hypertension Sister    Heart disease Maternal Grandmother     Social History   Tobacco Use   Smoking status: Current Every Day Smoker    Packs/day: 0.50    Types: Cigarettes   Smokeless tobacco: Never Used  Vaping Use   Vaping Use: Never used  Substance Use Topics   Alcohol use: Yes    Comment: occ   Drug use: Not Currently    Types: Marijuana    Home Medications Prior to Admission medications   Medication Sig Start Date End Date Taking? Authorizing Provider  ciprofloxacin (CIPRO) 500 MG tablet Take 1 tablet (500 mg total) by mouth 2 (two)  times daily. 06/23/20  Yes Thomasina Housley, Barbara Cower, MD  albuterol (VENTOLIN HFA) 108 (90 Base) MCG/ACT inhaler Inhale into the lungs every 6 (six) hours as needed for wheezing or shortness of breath.    [provider]  cyclobenzaprine (FLEXERIL) 10 MG tablet Take 1 tablet (10 mg total) by mouth 2 (two) times daily as needed for muscle spasms. 04/08/20   Curatolo, Adam, DO  cyclobenzaprine (FLEXERIL) 10 MG tablet TAKE 1 TABLET (10 MG TOTAL) BY MOUTH 2 (TWO) TIMES DAILY AS NEEDED FOR MUSCLE SPASMS. 04/08/20 04/08/21  Curatolo, Adam, DO  methylPREDNISolone (MEDROL DOSEPAK) 4 MG TBPK tablet TAKE 6 TABS BY MOUTH ON DAY 1; 5 TABS ON DAY 2; 4 TABS ON DAY 3; 3 TABS ON DAY 4; 2 TABS ON DAY 5; 1 TAB ON DAY 6 THEN STOP 04/08/20 04/08/21  Curatolo, Adam, DO  metroNIDAZOLE (FLAGYL) 500 MG tablet TAKE 1 TABLET (500 MG TOTAL) BY MOUTH 2 (TWO) TIMES DAILY FOR 7 DAYS.  04/08/20 04/08/21  Virgina Norfolk, DO    Allergies    Penicillins  Review of Systems   Review of Systems  Gastrointestinal:  Negative for abdominal pain.  Genitourinary:  Positive for dysuria.  All other systems reviewed and are negative.  Physical Exam Updated Vital Signs BP (!) 130/99   Pulse 77   Temp 98.4 F (36.9 C)   Resp 16   Ht 5\' 5"  (1.651 m)   Wt 87.5 kg   SpO2 100%   BMI 32.12 kg/m   Physical Exam Vitals and nursing note reviewed.  Constitutional:      Appearance: She is well-developed.  HENT:     Head: Normocephalic and atraumatic.     Mouth/Throat:     Mouth: Mucous membranes are moist.     Pharynx: Oropharynx is clear.  Cardiovascular:     Rate and Rhythm: Normal rate and regular rhythm.  Pulmonary:     Effort: No respiratory distress.     Breath sounds: No stridor.  Abdominal:     General: Abdomen is flat. There is no distension.  Musculoskeletal:        General: No swelling or tenderness. Normal range of motion.     Cervical back: Normal range of motion.  Skin:    General: Skin is warm and dry.  Neurological:      General: No focal deficit present.     Mental Status: She is alert.    ED Results / Procedures / Treatments   Labs (all labs ordered are listed, but only abnormal results are displayed) Labs Reviewed  URINALYSIS, ROUTINE W REFLEX MICROSCOPIC - Abnormal; Notable for the following components:      Result Value   APPearance CLOUDY (*)    Specific Gravity, Urine >1.030 (*)    Hgb urine dipstick LARGE (*)    Bilirubin Urine SMALL (*)    Ketones, ur 40 (*)    Protein, ur 100 (*)    All other components within normal limits  URINALYSIS, MICROSCOPIC (REFLEX) - Abnormal; Notable for the following components:   Bacteria, UA FEW (*)    All other components within normal limits  PREGNANCY, URINE    EKG None  Radiology No results found.  Procedures Procedures   Medications Ordered in ED Medications  ciprofloxacin (CIPRO) tablet 500 mg (500 mg Oral Given 06/23/20 0450)    ED Course  I have reviewed the triage vital signs and the nursing notes.  Pertinent labs & imaging results that were available during my care of the patient were reviewed by me and considered in my medical decision making (see chart for details).    MDM Rules/Calculators/A&P                          Likely UTI. Will treat for same.   Final Clinical Impression(s) / ED Diagnoses Final diagnoses:  Lower urinary tract infectious disease    Rx / DC Orders ED Discharge Orders          Ordered    ciprofloxacin (CIPRO) 500 MG tablet  2 times daily        06/23/20 0449             Brach Birdsall, 08/23/20, MD 06/29/20 07/01/20

## 2020-09-23 ENCOUNTER — Emergency Department (HOSPITAL_BASED_OUTPATIENT_CLINIC_OR_DEPARTMENT_OTHER)
Admission: EM | Admit: 2020-09-23 | Discharge: 2020-09-23 | Disposition: A | Discharge status: Home / Self Care | Payer: Self-pay | Attending: Emergency Medicine | Admitting: Emergency Medicine

## 2020-09-23 ENCOUNTER — Encounter (HOSPITAL_BASED_OUTPATIENT_CLINIC_OR_DEPARTMENT_OTHER): Payer: Self-pay | Admitting: *Deleted

## 2020-09-23 ENCOUNTER — Other Ambulatory Visit: Payer: Self-pay

## 2020-09-23 DIAGNOSIS — F17210 Nicotine dependence, cigarettes, uncomplicated: Secondary | ICD-10-CM | POA: Insufficient documentation

## 2020-09-23 DIAGNOSIS — K0889 Other specified disorders of teeth and supporting structures: Secondary | ICD-10-CM | POA: Insufficient documentation

## 2020-09-23 MED ORDER — CLINDAMYCIN HCL 150 MG PO CAPS: 300.0000 mg | ORAL_CAPSULE | Freq: Three times a day (TID) | ORAL | 0 refills | Status: DC

## 2020-09-23 MED ORDER — CLINDAMYCIN HCL 150 MG PO CAPS: 300.0000 mg | ORAL_CAPSULE | Freq: Three times a day (TID) | ORAL | 0 refills | Status: AC

## 2020-09-23 MED ORDER — IBUPROFEN 800 MG PO TABS
800.0000 mg | ORAL_TABLET | Freq: Once | ORAL | Status: AC
  Administered 2020-09-23: 800 mg via ORAL
  Filled 2020-09-23: qty 1

## 2020-09-23 NOTE — Discharge Instructions (Addendum)
Low-cost dental clinic: [facility]  at [phone]**   You may also call [phone]  Dental Assistance If the dentist on-call cannot see you, please use the resources below:   Patients with Medicaid: [facility] [phone] [address], [phone] [phone]  If unable to pay, or uninsured, contact [facility] [phone]) or [facility] Department [phone] in [place], [phone] in [facility]) to become qualified for the adult dental clinic  Other Low-Cost Community Dental Services: [facility]- [address] Natasha Bence [place], [place], [address]    [phone], Ext. [phone]    2nd and 4th Thursday of the month at 6:30am    10 clients each day by appointment, can sometimes see walk-in     patients if someone does not show for an appointment [facility]- [address] [place], [place], [address]    [phone] [facility] [address], [place], [place], [address]    [phone]  [facility] Department- [phone] [facility] Health Department- [phone] [facility] Department- [phone]

## 2020-09-23 NOTE — ED Provider Notes (Signed)
Emergency Department Provider Note   I have reviewed the triage vital signs and the nursing notes.   HISTORY  Chief Complaint Dental Pain   HPI Regina Rojas is a 30 y.o. female who presents to the emergency department for evaluation of left upper dental pain for the past day.  She has chronically poor dentition and pain.  She not having fevers, difficulty swallowing, change in voice.  She does not have a dentist for follow-up.  Denies any dental injury or bleeding.  Pain is moderate to severe and worse with chewing or touching the area.    Past Medical History:  Diagnosis Date   Anemia    Bipolar disorder (HCC)    Cervical dysplasia    Depression    Sickle cell trait [facility])     Patient Active Problem List   Diagnosis Date Noted   Negative pregnancy test 07/28/2018   Sickle cell trait (HCC)    Depression    Bipolar disorder (HCC)    Anemia    Cervical dysplasia     Past Surgical History:  Procedure Laterality Date   COLPOSCOPY     GYNECOLOGIC CRYOSURGERY      Allergies Penicillins  Family History  Problem Relation Age of Onset   Diabetes Mother    Hypertension Mother    Hypertension Sister    Heart disease Maternal Grandmother     Social History Social History   Tobacco Use   Smoking status: Every Day    Packs/day: 0.50    Types: Cigarettes   Smokeless tobacco: Never  Vaping Use   Vaping Use: Never used  Substance Use Topics   Alcohol use: Yes    Comment: occ   Drug use: Not Currently    Types: Marijuana    Review of Systems  Constitutional: No fever/chills Eyes: No visual changes. ENT: No sore throat. Positive left upper dental pain.  Cardiovascular: Denies chest pain. Respiratory: Denies shortness of breath. Gastrointestinal: No abdominal pain.  No nausea, no vomiting.  No diarrhea.  No constipation. Genitourinary: Negative for dysuria. Musculoskeletal: Negative for back pain. Skin: Negative for rash. Neurological: Negative for  headaches, focal weakness or numbness.  10-point ROS otherwise negative.  ____________________________________________   PHYSICAL EXAM:  VITAL SIGNS: ED Triage Vitals  Enc Vitals Group     BP 09/23/20 2209 (!) 121/92     Pulse Rate 09/23/20 2209 78     Resp 09/23/20 2209 16     Temp 09/23/20 2209 98.2 F (36.8 C)     Temp Source 09/23/20 2209 Oral     SpO2 09/23/20 2209 100 %     Weight 09/23/20 2207 183 lb (83 kg)     Height 09/23/20 2207 5\' 5"  (1.651 m)   Constitutional: Alert and oriented. Well appearing and in no acute distress. Eyes: Conjunctivae are normal.  Head: Atraumatic. Nose: No congestion/rhinnorhea. Mouth/Throat: Mucous membranes are moist.  Oropharynx non-erythematous. Diffuse poor dentition without abscess, trismus, or other acute findings.  Neck: No stridor.   Cardiovascular: Normal rate, regular rhythm.  Respiratory: Normal respiratory effort.  Gastrointestinal: No distention.  Musculoskeletal: No gross deformities of extremities. Neurologic:  Normal speech and language.  Skin:  Skin is warm, dry and intact. No rash noted.  ____________________________________________   PROCEDURES  Procedure(s) performed:   Procedures  None  ____________________________________________   INITIAL IMPRESSION / ASSESSMENT AND PLAN / ED COURSE  Pertinent labs & imaging results that were available during my care of the patient were  reviewed by me and considered in my medical decision making (see chart for details).   Patient presents emergency department with dental pain.  No drainable dental abscess.  No hard signs of deeper space neck or face infection requiring advanced imaging.  Patient has a penicillin allergy which I reviewed with her.  Plan for clindamycin and gave listed area dental resource as well as the contact information for the dentist on-call.    ____________________________________________  FINAL CLINICAL IMPRESSION(S) / ED DIAGNOSES  Final  diagnoses:  Pain, dental     MEDICATIONS GIVEN DURING THIS VISIT:  Medications  ibuprofen (ADVIL) tablet 800 mg (800 mg Oral Given 09/23/20 2251)     NEW OUTPATIENT MEDICATIONS STARTED DURING THIS VISIT:  Discharge Medication List as of 09/23/2020 10:45 PM     START taking these medications   Details  clindamycin (CLEOCIN) 150 MG capsule Take 2 capsules (300 mg total) by mouth 3 (three) times daily for 7 days., Starting Wed 09/23/2020, Until Wed 09/30/2020, Normal        Note:  This document was prepared using Dragon voice recognition software and may include unintentional dictation errors.  Alona Bene, MD, [facility] Emergency Medicine    Lincon Sahlin, Arlyss Repress, MD 09/27/20 1655

## 2020-09-23 NOTE — ED Triage Notes (Signed)
C/o dental pain x 1 day

## 2020-09-24 ENCOUNTER — Emergency Department (HOSPITAL_COMMUNITY)
Admission: EM | Admit: 2020-09-24 | Discharge: 2020-09-25 | Disposition: A | Discharge status: Home / Self Care | Payer: Self-pay | Attending: Emergency Medicine | Admitting: Emergency Medicine

## 2020-09-24 ENCOUNTER — Other Ambulatory Visit: Payer: Self-pay

## 2020-09-24 ENCOUNTER — Encounter (HOSPITAL_COMMUNITY): Payer: Self-pay

## 2020-09-24 DIAGNOSIS — H9209 Otalgia, unspecified ear: Secondary | ICD-10-CM | POA: Insufficient documentation

## 2020-09-24 DIAGNOSIS — Z5321 Procedure and treatment not carried out due to patient leaving prior to being seen by health care provider: Secondary | ICD-10-CM | POA: Insufficient documentation

## 2020-09-24 DIAGNOSIS — R112 Nausea with vomiting, unspecified: Secondary | ICD-10-CM | POA: Insufficient documentation

## 2020-09-24 DIAGNOSIS — K0889 Other specified disorders of teeth and supporting structures: Secondary | ICD-10-CM | POA: Insufficient documentation

## 2020-09-24 NOTE — ED Triage Notes (Signed)
Pt reports n/v dental pain and ear pain since yesterday. Seen at [facility] yesterday but did not get RX filled.

## 2020-09-25 NOTE — ED Notes (Signed)
Pt called 3x for room. Eloped.

## 2020-10-27 ENCOUNTER — Other Ambulatory Visit (HOSPITAL_COMMUNITY): Payer: Self-pay

## 2020-12-07 ENCOUNTER — Emergency Department (HOSPITAL_BASED_OUTPATIENT_CLINIC_OR_DEPARTMENT_OTHER): Payer: No Typology Code available for payment source

## 2020-12-07 ENCOUNTER — Other Ambulatory Visit: Payer: Self-pay

## 2020-12-07 ENCOUNTER — Encounter (HOSPITAL_BASED_OUTPATIENT_CLINIC_OR_DEPARTMENT_OTHER): Payer: Self-pay | Admitting: *Deleted

## 2020-12-07 ENCOUNTER — Emergency Department (HOSPITAL_BASED_OUTPATIENT_CLINIC_OR_DEPARTMENT_OTHER)
Admission: EM | Admit: 2020-12-07 | Discharge: 2020-12-08 | Disposition: A | Discharge status: Home / Self Care | Payer: No Typology Code available for payment source | Attending: Student | Admitting: Student

## 2020-12-07 DIAGNOSIS — F17210 Nicotine dependence, cigarettes, uncomplicated: Secondary | ICD-10-CM | POA: Insufficient documentation

## 2020-12-07 DIAGNOSIS — S99911A Unspecified injury of right ankle, initial encounter: Secondary | ICD-10-CM | POA: Diagnosis present

## 2020-12-07 DIAGNOSIS — M25571 Pain in right ankle and joints of right foot: Secondary | ICD-10-CM | POA: Diagnosis not present

## 2020-12-07 DIAGNOSIS — X501XXA Overexertion from prolonged static or awkward postures, initial encounter: Secondary | ICD-10-CM | POA: Insufficient documentation

## 2020-12-07 DIAGNOSIS — S93431A Sprain of tibiofibular ligament of right ankle, initial encounter: Secondary | ICD-10-CM | POA: Insufficient documentation

## 2020-12-07 DIAGNOSIS — S93491A Sprain of other ligament of right ankle, initial encounter: Secondary | ICD-10-CM

## 2020-12-07 MED ORDER — HYDROCODONE-ACETAMINOPHEN 5-325 MG PO TABS
1.0000 | ORAL_TABLET | Freq: Once | ORAL | Status: AC
  Administered 2020-12-07: 1 via ORAL
  Filled 2020-12-07: qty 1

## 2020-12-07 NOTE — Discharge Instructions (Addendum)
You may follow-up with the sports medicine provider listed in your discharge instructions if you have continued issues with this ankle beyond the next week.  Your examination today is most concerning for a muscular injury 1. Medications: alternate ibuprofen and tylenol for pain control, take all usual home medications as they are prescribed 2. Treatment: rest, ice, elevate and use an ACE wrap or other compressive therapy to decrease swelling. Also drink plenty of fluids and do plenty of gentle stretching and move the affected muscle through its normal range of motion to prevent stiffness. 3. Follow Up: If your symptoms do not improve please follow up with orthopedics/sports medicine or your PCP for discussion of your diagnoses and further evaluation after today's visit; if you do not have a primary care doctor use the resource guide provided to find one; Please return to the ER for worsening symptoms or other concerns.

## 2020-12-07 NOTE — ED Triage Notes (Signed)
C/o right ankle  rolling injury x 45 mins ago

## 2020-12-07 NOTE — ED Provider Notes (Addendum)
[facility] EMERGENCY DEPARTMENT Provider Note   CSN: [id number] Arrival date & time: 12/07/20  2300     History Chief Complaint  Patient presents with   Ankle Injury    Regina Rojas is a 30 y.o. female.  Patient sustained a right ankle injury about 1 hour ago where she rolled her ankle.  She rolled her right ankle internally.  She has significant swelling of the right ankle.  She denies any numbness or tingling.  Unable to move her right ankle.  She can wiggle her toes.  She took ibuprofen prior to coming here.  She has not been weightbearing.   Ankle Injury Pertinent negatives include no chest pain, no abdominal pain, no headaches and no shortness of breath.      Past Medical History:  Diagnosis Date   Anemia    Bipolar disorder ([place])    Cervical dysplasia    Depression    Sickle cell trait [facility])     Patient Active Problem List   Diagnosis Date Noted   Negative pregnancy test 07/28/2018   Sickle cell trait (HCC)    Depression    Bipolar disorder ([place])    Anemia    Cervical dysplasia     Past Surgical History:  Procedure Laterality Date   COLPOSCOPY     GYNECOLOGIC CRYOSURGERY       OB History     Gravida  3   Para      Term      Preterm      AB  2   Living  0      SAB  2   IAB      Ectopic      Multiple      Live Births              Family History  Problem Relation Age of Onset   Diabetes Mother    Hypertension Mother    Hypertension Sister    Heart disease Maternal Grandmother     Social History   Tobacco Use   Smoking status: Every Day    Packs/day: 0.50    Types: Cigarettes   Smokeless tobacco: Never  Vaping Use   Vaping Use: Never used  Substance Use Topics   Alcohol use: Yes    Comment: occ   Drug use: Not Currently    Types: Marijuana    Home Medications Prior to Admission medications   Medication Sig Start Date End Date Taking? Authorizing Provider  albuterol (VENTOLIN HFA) 108 (90 Base)  MCG/ACT inhaler Inhale into the lungs every 6 (six) hours as needed for wheezing or shortness of breath.    [provider]  ciprofloxacin (CIPRO) 500 MG tablet Take 1 tablet (500 mg total) by mouth 2 (two) times daily. 06/23/20   Mesner, Corene Cornea, MD  cyclobenzaprine (FLEXERIL) 10 MG tablet Take 1 tablet (10 mg total) by mouth 2 (two) times daily as needed for muscle spasms. 04/08/20   Curatolo, Adam, DO  cyclobenzaprine (FLEXERIL) 10 MG tablet TAKE 1 TABLET (10 MG TOTAL) BY MOUTH 2 (TWO) TIMES DAILY AS NEEDED FOR MUSCLE SPASMS. 04/08/20 04/08/21  Curatolo, Adam, DO  methylPREDNISolone (MEDROL DOSEPAK) 4 MG TBPK tablet TAKE 6 TABS BY MOUTH ON DAY 1; 5 TABS ON DAY 2; 4 TABS ON DAY 3; 3 TABS ON DAY 4; 2 TABS ON DAY 5; 1 TAB ON DAY 6 THEN STOP 04/08/20 04/08/21  Curatolo, Adam, DO  metroNIDAZOLE (FLAGYL) 500 MG tablet TAKE  1 TABLET (500 MG TOTAL) BY MOUTH 2 (TWO) TIMES DAILY FOR 7 DAYS. 04/08/20 04/08/21  Virgina Norfolk, DO    Allergies    Penicillins  Review of Systems   Review of Systems  Constitutional:  Negative for chills and fever.  HENT:  Negative for congestion, rhinorrhea and sore throat.   Eyes:  Negative for visual disturbance.  Respiratory:  Negative for cough, chest tightness and shortness of breath.   Cardiovascular:  Negative for chest pain, palpitations and leg swelling.  Gastrointestinal:  Negative for abdominal pain, blood in stool, constipation, diarrhea, nausea and vomiting.  Genitourinary:  Negative for dysuria, flank pain and hematuria.  Musculoskeletal:  Positive for arthralgias and joint swelling. Negative for back pain.  Skin:  Negative for rash and wound.  Neurological:  Negative for dizziness, syncope, weakness, light-headedness and headaches.  Psychiatric/Behavioral:  Negative for confusion.   All other systems reviewed and are negative.  Physical Exam Updated Vital Signs BP 117/80   Pulse 71   Temp 97.7 F (36.5 C)   Resp 16   Ht 5\' 5"  (1.651 m)   Wt 83 kg    LMP 11/20/2020   SpO2 98%   BMI 30.45 kg/m   Physical Exam Vitals and nursing note reviewed.  Constitutional:      General: She is not in acute distress.    Appearance: She is not toxic-appearing.  HENT:     Head: Normocephalic and atraumatic.  Eyes:     General: No scleral icterus.       Right eye: No discharge.        Left eye: No discharge.     Conjunctiva/sclera: Conjunctivae normal.  Cardiovascular:     Pulses:          Dorsalis pedis pulses are 2+ on the right side and 2+ on the left side.  Pulmonary:     Effort: Pulmonary effort is normal. No respiratory distress.  Musculoskeletal:     Right lower leg: Normal.     Left lower leg: Normal.     Right ankle: Swelling present. No deformity or ecchymosis. Tenderness present over the lateral malleolus. Decreased range of motion. Normal pulse.     Left ankle: Normal.  Skin:    General: Skin is warm and dry.  Neurological:     Mental Status: She is alert.  Psychiatric:        Mood and Affect: Mood normal.        Behavior: Behavior normal.    ED Results / Procedures / Treatments   Labs (all labs ordered are listed, but only abnormal results are displayed) Labs Reviewed - No data to display  EKG None  Radiology DG Ankle Complete Right  Result Date: 12/07/2020 CLINICAL DATA:  Rolling injury EXAM: RIGHT ANKLE - COMPLETE 3+ VIEW COMPARISON:  None. FINDINGS: No fracture or malalignment. Marked lateral soft tissue swelling with ankle effusion. IMPRESSION: Considerable soft tissue swelling.  No acute osseous abnormality Electronically Signed   By: 12/09/2020 M.D.   On: 12/07/2020 23:27    Procedures Procedures   Medications Ordered in ED Medications  HYDROcodone-acetaminophen (NORCO/VICODIN) 5-325 MG per tablet 1 tablet (has no administration in time range)    ED Course  I have reviewed the triage vital signs and the nursing notes.  Pertinent labs & imaging results that were available during my care of the  patient were reviewed by me and considered in my medical decision making (see chart for details).  MDM Rules/Calculators/A&P                         Presents to the emergency department after rolling her right ankle.  The lateral malleolus is affected.  There is significant swelling with no bruising noted.  Pedal pulses 2+ bilaterally.  Has good sensation distal to injury.  Able to wiggle toes.  Limited range of motion of right ankle and nonweightbearing so far.  X-ray obtained was not notable for fracture.  Patient likely has sustained an ankle sprain.  Recommend supportive treatment at home.  [place] instructions provided.  We will wrap an Ace wrap and provide crutches following discharge.    Return precautions provided if patient is unable to be weightbearing after 1 week.   Final Clinical Impression(s) / ED Diagnoses Final diagnoses:  Sprain of anterior talofibular ligament of right ankle, initial encounter    Rx / DC Orders ED Discharge Orders     None        Adolphus Birchwood, PA-C 12/07/20 2341    Adolphus Birchwood, PA-C 12/07/20 2342    Teressa Lower, MD 12/08/20 [phone]

## 2020-12-16 ENCOUNTER — Encounter: Payer: Self-pay | Admitting: Obstetrics and Gynecology

## 2020-12-30 ENCOUNTER — Emergency Department (HOSPITAL_BASED_OUTPATIENT_CLINIC_OR_DEPARTMENT_OTHER): Payer: PRIVATE HEALTH INSURANCE

## 2020-12-30 ENCOUNTER — Other Ambulatory Visit: Payer: Self-pay

## 2020-12-30 ENCOUNTER — Encounter (HOSPITAL_BASED_OUTPATIENT_CLINIC_OR_DEPARTMENT_OTHER): Payer: Self-pay

## 2020-12-30 ENCOUNTER — Emergency Department (HOSPITAL_BASED_OUTPATIENT_CLINIC_OR_DEPARTMENT_OTHER)
Admission: EM | Admit: 2020-12-30 | Discharge: 2020-12-30 | Disposition: A | Discharge status: Home / Self Care | Payer: PRIVATE HEALTH INSURANCE | Attending: Emergency Medicine | Admitting: Emergency Medicine

## 2020-12-30 DIAGNOSIS — Z20822 Contact with and (suspected) exposure to covid-19: Secondary | ICD-10-CM | POA: Diagnosis not present

## 2020-12-30 DIAGNOSIS — J069 Acute upper respiratory infection, unspecified: Secondary | ICD-10-CM | POA: Diagnosis not present

## 2020-12-30 DIAGNOSIS — R000 Tachycardia, unspecified: Secondary | ICD-10-CM | POA: Insufficient documentation

## 2020-12-30 DIAGNOSIS — F17210 Nicotine dependence, cigarettes, uncomplicated: Secondary | ICD-10-CM | POA: Insufficient documentation

## 2020-12-30 DIAGNOSIS — R059 Cough, unspecified: Secondary | ICD-10-CM | POA: Diagnosis present

## 2020-12-30 LAB — RESP PANEL BY RT-PCR (FLU A&B, COVID) ARPGX2
Influenza A by PCR: NEGATIVE
Influenza B by PCR: NEGATIVE
SARS Coronavirus 2 by RT PCR: NEGATIVE

## 2020-12-30 MED ORDER — BENZONATATE 100 MG PO CAPS: 100.0000 mg | ORAL_CAPSULE | Freq: Three times a day (TID) | ORAL | 0 refills | Status: DC

## 2020-12-30 NOTE — Discharge Instructions (Addendum)
Please refer to the attached instructions 

## 2020-12-30 NOTE — ED Provider Notes (Signed)
[facility] EMERGENCY DEPARTMENT Provider Note   CSN: [id number] Arrival date & time: 12/30/20  2132     History Chief Complaint  Patient presents with   Cough    Regina Rojas is a 30 y.o. female.  Patient with cough, headache, body aches for several days. Has been exposed to a coworker with URI symptoms. Cough has been productive of yellow sputum and occasional blood streaks. No sore throat, abdominal pain, nausea, vomiting or diarrhea. No documented fevers or chills.  The history is provided by the patient. No language interpreter was used.  Cough Cough characteristics:  Productive Sputum characteristics:  Yellow Severity:  Moderate Onset quality:  Gradual Duration:  4 days Timing:  Intermittent Progression:  Waxing and waning Chronicity:  New Context: sick contacts and upper respiratory infection   Associated symptoms: headaches and myalgias   Associated symptoms: no shortness of breath and no sore throat       Past Medical History:  Diagnosis Date   Anemia    Bipolar disorder (HCC)    Cervical dysplasia    Depression    Sickle cell trait (HCC)     Patient Active Problem List   Diagnosis Date Noted   Negative pregnancy test 07/28/2018   Sickle cell trait (HCC)    Depression    Bipolar disorder (HCC)    Anemia    Cervical dysplasia     Past Surgical History:  Procedure Laterality Date   COLPOSCOPY     GYNECOLOGIC CRYOSURGERY       OB History     Gravida  3   Para      Term      Preterm      AB  2   Living  0      SAB  2   IAB      Ectopic      Multiple      Live Births              Family History  Problem Relation Age of Onset   Diabetes Mother    Hypertension Mother    Hypertension Sister    Heart disease Maternal Grandmother     Social History   Tobacco Use   Smoking status: Every Day    Packs/day: 0.50    Types: Cigarettes   Smokeless tobacco: Never  Vaping Use   Vaping Use: Never used   Substance Use Topics   Alcohol use: Yes    Comment: occ   Drug use: Not Currently    Types: Marijuana    Home Medications Prior to Admission medications   Medication Sig Start Date End Date Taking? Authorizing Provider  albuterol (VENTOLIN HFA) 108 (90 Base) MCG/ACT inhaler Inhale into the lungs every 6 (six) hours as needed for wheezing or shortness of breath.    [provider]  ciprofloxacin (CIPRO) 500 MG tablet Take 1 tablet (500 mg total) by mouth 2 (two) times daily. 06/23/20   Mesner, Barbara Cower, MD  cyclobenzaprine (FLEXERIL) 10 MG tablet Take 1 tablet (10 mg total) by mouth 2 (two) times daily as needed for muscle spasms. 04/08/20   Curatolo, Adam, DO  cyclobenzaprine (FLEXERIL) 10 MG tablet TAKE 1 TABLET (10 MG TOTAL) BY MOUTH 2 (TWO) TIMES DAILY AS NEEDED FOR MUSCLE SPASMS. 04/08/20 04/08/21  Curatolo, Adam, DO  methylPREDNISolone (MEDROL DOSEPAK) 4 MG TBPK tablet TAKE 6 TABS BY MOUTH ON DAY 1; 5 TABS ON DAY 2; 4 TABS ON DAY 3; 3  TABS ON DAY 4; 2 TABS ON DAY 5; 1 TAB ON DAY 6 THEN STOP 04/08/20 04/08/21  Curatolo, Adam, DO  metroNIDAZOLE (FLAGYL) 500 MG tablet TAKE 1 TABLET (500 MG TOTAL) BY MOUTH 2 (TWO) TIMES DAILY FOR 7 DAYS. 04/08/20 04/08/21  Virgina Norfolk, DO    Allergies    Penicillins  Review of Systems   Review of Systems  Constitutional:  Positive for fatigue.  HENT:  Negative for sore throat.   Respiratory:  Positive for cough. Negative for shortness of breath.   Musculoskeletal:  Positive for myalgias.  Neurological:  Positive for headaches.  All other systems reviewed and are negative.  Physical Exam Updated Vital Signs BP 115/76 (BP Location: Left Arm)    Pulse (!) 111    Temp 98.8 F (37.1 C) (Oral)    Resp 20    Ht 5\' 5"  (1.651 m)    Wt 92.5 kg    LMP 11/20/2020    SpO2 99%    BMI 33.95 kg/m   Physical Exam Constitutional:      Appearance: Normal appearance.  HENT:     Head: Normocephalic.     Nose: Nose normal.     Mouth/Throat:     Mouth: Mucous  membranes are moist.  Eyes:     Conjunctiva/sclera: Conjunctivae normal.  Cardiovascular:     Rate and Rhythm: Tachycardia present.  Pulmonary:     Effort: Pulmonary effort is normal.  Musculoskeletal:        General: Normal range of motion.     Cervical back: Normal range of motion.  Skin:    General: Skin is warm and dry.  Neurological:     Mental Status: She is alert and oriented to person, place, and time.  Psychiatric:        Mood and Affect: Mood normal.        Behavior: Behavior normal.    ED Results / Procedures / Treatments   Labs (all labs ordered are listed, but only abnormal results are displayed) Labs Reviewed  RESP PANEL BY RT-PCR (FLU A&B, COVID) [id number]    EKG None  Radiology DG Chest Portable 1 View  Result Date: 12/30/2020 CLINICAL DATA:  Cough, flu like symptoms EXAM: PORTABLE CHEST 1 VIEW COMPARISON:  03/16/2018 FINDINGS: Heart is borderline in size. Lungs are clear. No effusions. No acute bony abnormality. IMPRESSION: No active disease. Electronically Signed   By: 03/18/2018 M.D.   On: 12/30/2020 22:58    Procedures Procedures   Medications Ordered in ED Medications - No data to display  ED Course  I have reviewed the triage vital signs and the nursing notes.  Pertinent labs & imaging results that were available during my care of the patient were reviewed by me and considered in my medical decision making (see chart for details).    MDM Rules/Calculators/A&P                           Pt symptoms consistent with URI. CXR negative for acute infiltrate. Pt will be discharged with symptomatic treatment.  Discussed return precautions.  Pt is hemodynamically stable & in NAD prior to discharge.  Final Clinical Impression(s) / ED Diagnoses Final diagnoses:  Viral URI with cough    Rx / DC Orders ED Discharge Orders          Ordered    benzonatate (TESSALON) 100 MG capsule  Every 8 hours  12/30/20 2344             Felicie Morn,  NP 12/30/20 2346    Melene Plan, DO 01/01/21 1505

## 2020-12-30 NOTE — ED Triage Notes (Signed)
Pt c/o flu like sx x 5 days-NAD-steady gait 

## 2021-01-22 ENCOUNTER — Other Ambulatory Visit (HOSPITAL_COMMUNITY): Payer: Self-pay

## 2021-04-05 ENCOUNTER — Other Ambulatory Visit: Payer: Self-pay

## 2021-04-05 ENCOUNTER — Emergency Department (HOSPITAL_BASED_OUTPATIENT_CLINIC_OR_DEPARTMENT_OTHER)
Admission: EM | Admit: 2021-04-05 | Discharge: 2021-04-05 | Disposition: A | Discharge status: Home / Self Care | Payer: PRIVATE HEALTH INSURANCE | Attending: Emergency Medicine | Admitting: Emergency Medicine

## 2021-04-05 ENCOUNTER — Encounter (HOSPITAL_BASED_OUTPATIENT_CLINIC_OR_DEPARTMENT_OTHER): Payer: Self-pay | Admitting: *Deleted

## 2021-04-05 DIAGNOSIS — R21 Rash and other nonspecific skin eruption: Secondary | ICD-10-CM | POA: Diagnosis present

## 2021-04-05 MED ORDER — CLOTRIMAZOLE 1 % EX CREA: TOPICAL_CREAM | CUTANEOUS | 0 refills | Status: DC

## 2021-04-05 NOTE — ED Triage Notes (Signed)
That that comes and goes x 2 days. No itching. Hx of eczema. Small circular lesions the size of cigarette Ricard.  ?

## 2021-04-05 NOTE — Discharge Instructions (Addendum)
Use the cream as prescribed.  May use Benadryl as needed for itching.  Return for any worsening symptoms ?

## 2021-04-05 NOTE — ED Provider Notes (Signed)
?[facility] EMERGENCY DEPARTMENT ?Provider Note ? ? ?CSN: [id number] ?Arrival date & time: 04/05/21  1805 ? ?  ? ?History ? ?Rash ? ? ?Regina Rojas is a 31 y.o. female here for evaluation of rash which began 2 days ago.  Primarily located under inferior aspect breast, upper abdomen, 1 lesion to left arm.  No new lotions, perfumes, detergents.  Intermittently pruritic.  Nontender.  No draining.  No one with similar rash.  No known insect bites.  NO meds PTA.  No sensation of throat closing, shortness of breath ? ?HPI ? ?  ? ?Home Medications ?Prior to Admission medications   ?Medication Sig Start Date End Date Taking? Authorizing Provider  ?clotrimazole (LOTRIMIN) 1 % cream Apply to affected area 2 times daily 04/05/21  Yes Yohan Samons A, PA-C  ?albuterol (VENTOLIN HFA) 108 (90 Base) MCG/ACT inhaler Inhale into the lungs every 6 (six) hours as needed for wheezing or shortness of breath.    [provider]  ?benzonatate (TESSALON) 100 MG capsule Take 1 capsule (100 mg total) by mouth every 8 (eight) hours. 12/30/20   Felicie Morn, NP  ?ciprofloxacin (CIPRO) 500 MG tablet Take 1 tablet (500 mg total) by mouth 2 (two) times daily. 06/23/20   Mesner, Barbara Cower, MD  ?cyclobenzaprine (FLEXERIL) 10 MG tablet Take 1 tablet (10 mg total) by mouth 2 (two) times daily as needed for muscle spasms. 04/08/20   Curatolo, Adam, DO  ?cyclobenzaprine (FLEXERIL) 10 MG tablet TAKE 1 TABLET (10 MG TOTAL) BY MOUTH 2 (TWO) TIMES DAILY AS NEEDED FOR MUSCLE SPASMS. 04/08/20 04/08/21  Curatolo, Adam, DO  ?methylPREDNISolone (MEDROL DOSEPAK) 4 MG TBPK tablet TAKE 6 TABS BY MOUTH ON DAY 1; 5 TABS ON DAY 2; 4 TABS ON DAY 3; 3 TABS ON DAY 4; 2 TABS ON DAY 5; 1 TAB ON DAY 6 THEN STOP 04/08/20 04/08/21  Curatolo, Adam, DO  ?metroNIDAZOLE (FLAGYL) 500 MG tablet TAKE 1 TABLET (500 MG TOTAL) BY MOUTH 2 (TWO) TIMES DAILY FOR 7 DAYS. 04/08/20 04/08/21  Virgina Norfolk, DO  ?   ? ?Allergies    ?Penicillins   ? ?Review of Systems   ?Review of  Systems  ?Constitutional: Negative.   ?HENT: Negative.    ?Respiratory: Negative.    ?Cardiovascular: Negative.   ?Gastrointestinal: Negative.   ?Genitourinary: Negative.   ?Musculoskeletal: Negative.   ?Skin:  Positive for rash.  ?Neurological: Negative.   ?All other systems reviewed and are negative. ? ?Physical Exam ?Updated Vital Signs ?BP (!) 133/94 (BP Location: Right Arm)   Pulse 82   Temp 98.3 ?F (36.8 ?C)   Resp 16   Ht 5\' 5"  (1.651 m)   Wt 92.5 kg   LMP 03/09/2021   SpO2 100%   BMI 33.93 kg/m?  ?Physical Exam ?Vitals and nursing note reviewed.  ?Constitutional:   ?   General: She is not in acute distress. ?   Appearance: She is well-developed. She is not ill-appearing, toxic-appearing or diaphoretic.  ?HENT:  ?   Head: Atraumatic.  ?Eyes:  ?   Pupils: Pupils are equal, round, and reactive to light.  ?Cardiovascular:  ?   Rate and Rhythm: Normal rate.  ?Pulmonary:  ?   Effort: No respiratory distress.  ?Abdominal:  ?   General: There is no distension.  ?Musculoskeletal:     ?   General: Normal range of motion.  ?   Cervical back: Normal range of motion.  ?Skin: ?   General: Skin is warm  and dry.  ?   Comments: Circular areas of erythema with central clearing approximately 4 to 5 cm, rounded to inferior breast crease, upper abdomen.  No target lesions, bulla, desquamated skin.  No fluctuance, induration  ?Neurological:  ?   General: No focal deficit present.  ?   Mental Status: She is alert.  ?Psychiatric:     ?   Mood and Affect: Mood normal.  ? ? ? ?ED Results / Procedures / Treatments   ?Labs ?(all labs ordered are listed, but only abnormal results are displayed) ?Labs Reviewed - No data to display ? ?EKG ?None ? ?Radiology ?No results found. ? ?Procedures ?Procedures  ? ? ?Medications Ordered in ED ?Medications - No data to display ? ?ED Course/ Medical Decision Making/ A&P ?  ? ?Rash consistent with likely fungal infection. Patient denies any difficulty breathing or swallowing.  Pt has a patent  airway without stridor and is handling secretions without difficulty; no angioedema. No blisters, no pustules, no warmth, no draining sinus tracts, no superficial abscesses, no bullous impetigo, no vesicles, no desquamation, no target lesions with dusky purpura or a central bulla. Not tender to touch. No concern for superimposed infection. No concern for SJS, TEN, TSS, tick borne illness, syphilis or other life-threatening condition.  Discharge home with topical fungal cream, discussed keep close eye in area, wash objects with warm soapy water, return for new or worsening symptoms, close follow-up with PCP.  Patient agreeable. ? ?The patient has been appropriately medically screened and/or stabilized in the ED. I have low suspicion for any other emergent medical condition which would require further screening, evaluation or treatment in the ED or require inpatient management. ? ?Patient is hemodynamically stable and in no acute distress.  Patient able to ambulate in department prior to ED.  Evaluation does not show acute pathology that would require ongoing or additional emergent interventions while in the emergency department or further inpatient treatment.  I have discussed the diagnosis with the patient and answered all questions.  Pain is been managed while in the emergency department and patient has no further complaints prior to discharge.  Patient is comfortable with plan discussed in room and is stable for discharge at this time.  I have discussed strict return precautions for returning to the emergency department.  Patient was encouraged to follow-up with PCP/specialist refer to at discharge.  ?                        ?Medical Decision Making ?Amount and/or Complexity of Data Reviewed ?External Data Reviewed: labs and notes. ? ?Risk ?OTC drugs. ?Prescription drug management. ? ? ? ? ? ? ? ? ? ? ?Final Clinical Impression(s) / ED Diagnoses ?Final diagnoses:  ?Rash  ? ? ?Rx / DC Orders ?ED Discharge Orders    ? ?      Ordered  ?  clotrimazole (LOTRIMIN) 1 % cream       ? 04/05/21 1921  ? ?  ?  ? ?  ? ? ?  ?Kielee Care A, PA-C ?04/05/21 1931 ? ?  ?Virgina Norfolk, DO ?04/05/21 2307 ? ?

## 2021-04-05 NOTE — ED Notes (Signed)
Pt states this rash started a few days ago and keeps spreading.  Small circular areas noted to arms breasts and abdomen.  Pt reports no pain or itching ? ?

## 2021-10-09 ENCOUNTER — Other Ambulatory Visit: Payer: Self-pay

## 2021-10-09 ENCOUNTER — Emergency Department (HOSPITAL_COMMUNITY): Payer: No Typology Code available for payment source

## 2021-10-09 ENCOUNTER — Encounter (HOSPITAL_COMMUNITY): Payer: Self-pay

## 2021-10-09 ENCOUNTER — Emergency Department (HOSPITAL_COMMUNITY)
Admission: EM | Admit: 2021-10-09 | Discharge: 2021-10-09 | Disposition: A | Discharge status: Home / Self Care | Payer: No Typology Code available for payment source | Attending: Emergency Medicine | Admitting: Emergency Medicine

## 2021-10-09 DIAGNOSIS — S2002XA Contusion of left breast, initial encounter: Secondary | ICD-10-CM | POA: Diagnosis not present

## 2021-10-09 DIAGNOSIS — M25512 Pain in left shoulder: Secondary | ICD-10-CM | POA: Diagnosis not present

## 2021-10-09 DIAGNOSIS — S59912A Unspecified injury of left forearm, initial encounter: Secondary | ICD-10-CM | POA: Diagnosis present

## 2021-10-09 DIAGNOSIS — Y92410 Unspecified street and highway as the place of occurrence of the external cause: Secondary | ICD-10-CM | POA: Diagnosis not present

## 2021-10-09 DIAGNOSIS — S5012XA Contusion of left forearm, initial encounter: Secondary | ICD-10-CM | POA: Insufficient documentation

## 2021-10-09 DIAGNOSIS — R519 Headache, unspecified: Secondary | ICD-10-CM | POA: Insufficient documentation

## 2021-10-09 LAB — I-STAT BETA HCG BLOOD, ED (MC, WL, AP ONLY): I-stat hCG, quantitative: <5 m[IU]/mL (ref <5)

## 2021-10-09 MED ORDER — METHOCARBAMOL 500 MG PO TABS: 500.0000 mg | ORAL_TABLET | Freq: Two times a day (BID) | ORAL | 0 refills | Status: DC

## 2021-10-09 MED ORDER — METHOCARBAMOL 500 MG PO TABS
500.0000 mg | ORAL_TABLET | Freq: Two times a day (BID) | ORAL | 0 refills | Status: DC
Start: 2021-10-09 — End: 2021-10-09

## 2021-10-09 MED ORDER — HYDROCODONE-ACETAMINOPHEN 5-325 MG PO TABS: 1.0000 | ORAL_TABLET | ORAL | 0 refills | Status: DC | PRN

## 2021-10-09 MED ORDER — HYDROCODONE-ACETAMINOPHEN 5-325 MG PO TABS: 1.0000 | ORAL_TABLET | Freq: Four times a day (QID) | ORAL | 0 refills | Status: DC | PRN

## 2021-10-09 MED ORDER — METHOCARBAMOL 500 MG PO TABS: 500.0000 mg | ORAL_TABLET | Freq: Once | ORAL | Status: DC

## 2021-10-09 MED ORDER — NAPROXEN 500 MG PO TABS: 500.0000 mg | ORAL_TABLET | Freq: Two times a day (BID) | ORAL | 0 refills | Status: DC

## 2021-10-09 MED ORDER — OXYCODONE-ACETAMINOPHEN 5-325 MG PO TABS
1.0000 | ORAL_TABLET | Freq: Once | ORAL | Status: AC
  Administered 2021-10-09: 1 via ORAL
  Filled 2021-10-09: qty 1
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
## 2022-03-28 ENCOUNTER — Other Ambulatory Visit: Payer: Self-pay

## 2022-03-28 ENCOUNTER — Emergency Department (HOSPITAL_BASED_OUTPATIENT_CLINIC_OR_DEPARTMENT_OTHER)
Admission: EM | Admit: 2022-03-28 | Discharge: 2022-03-28 | Disposition: A | Discharge status: Home / Self Care | Payer: Medicaid Other | Attending: Emergency Medicine | Admitting: Emergency Medicine

## 2022-03-28 ENCOUNTER — Encounter (HOSPITAL_BASED_OUTPATIENT_CLINIC_OR_DEPARTMENT_OTHER): Payer: Self-pay

## 2022-03-28 DIAGNOSIS — K0889 Other specified disorders of teeth and supporting structures: Secondary | ICD-10-CM | POA: Diagnosis present

## 2022-03-28 MED ORDER — CLINDAMYCIN HCL 150 MG PO CAPS
450.0000 mg | ORAL_CAPSULE | Freq: Three times a day (TID) | ORAL | 0 refills | Status: AC
Start: 2022-03-28 — End: 2022-04-04

## 2022-03-28 MED ORDER — CLINDAMYCIN HCL 150 MG PO CAPS: 450.0000 mg | ORAL_CAPSULE | Freq: Three times a day (TID) | ORAL | 0 refills | Status: DC

## 2022-03-28 NOTE — ED Triage Notes (Signed)
Pt presents via pov with complaints of left sided dental painX2 days. Pt has swelling to left side of face. Pt also endorses diarrhea. Denies N&V.

## 2022-03-28 NOTE — Discharge Instructions (Signed)
You were seen in the emergency department for dental pain.  As we discussed I think your pain is likely related to cavities. We normally treat this with anti-inflammatories (tylenol/ibuprofen),  and antibiotics.   I've attached a resource guide with several dentists in the area. It's incredibly important you follow up with a dentist as soon as possible for definitive treatment.   Continue to monitor how you're doing and return to the ER for new or worsening symptoms such as difficulty swallowing your own saliva, difficulty breathing, or fever.

## 2022-03-28 NOTE — ED Provider Notes (Signed)
[place] [facility] Provider Note   CSN: [id number] Arrival date & time: 03/28/22  1334     History  Chief Complaint  Patient presents with   Dental Pain    Regina Rojas is a 32 y.o. female.  Patient with no pertinent past medical history presents today with complaints of dental pain. She states that same began 2 days ago and is located in her right lower molar. Denies fevers or chills. She is able to open and close her mouth without difficulty. She is tolerating secretions.  She has not seen a dentist.  The history is provided by the patient. No language interpreter was used.  Dental Pain      Home Medications Prior to Admission medications   Medication Sig Start Date End Date Taking? Authorizing Provider  albuterol (VENTOLIN HFA) 108 (90 Base) MCG/ACT inhaler Inhale into the lungs every 6 (six) hours as needed for wheezing or shortness of breath.    [provider]  benzonatate (TESSALON) 100 MG capsule Take 1 capsule (100 mg total) by mouth every 8 (eight) hours. 12/30/20   Etta Quill, NP  ciprofloxacin (CIPRO) 500 MG tablet Take 1 tablet (500 mg total) by mouth 2 (two) times daily. 06/23/20   Mesner, Corene Cornea, MD  clotrimazole (LOTRIMIN) 1 % cream Apply to affected area 2 times daily 04/05/21   Henderly, Britni A, PA-C  cyclobenzaprine (FLEXERIL) 10 MG tablet Take 1 tablet (10 mg total) by mouth 2 (two) times daily as needed for muscle spasms. 04/08/20   Curatolo, Adam, DO  HYDROcodone-acetaminophen (NORCO/VICODIN) 5-325 MG tablet Take 1 tablet by mouth every 6 (six) hours as needed for severe pain. 10/09/21   Suzy Bouchard, PA-C  methocarbamol (ROBAXIN) 500 MG tablet Take 1 tablet (500 mg total) by mouth 2 (two) times daily. 10/09/21   Suzy Bouchard, PA-C  naproxen (NAPROSYN) 500 MG tablet Take 1 tablet (500 mg total) by mouth 2 (two) times daily. 10/09/21   Suzy Bouchard, PA-C      Allergies    Penicillins     Review of Systems   Review of Systems  HENT:  Positive for dental problem.   All other systems reviewed and are negative.   Physical Exam Updated Vital Signs BP 120/89 (BP Location: Right Arm)   Pulse 72   Temp 97.8 F (36.6 C)   Resp 18   Ht '5\' 5"'$  (1.651 m)   Wt 90.7 kg   SpO2 100%   BMI 33.28 kg/m  Physical Exam Vitals and nursing note reviewed.  Constitutional:      General: She is not in acute distress.    Appearance: Normal appearance. She is normal weight. She is not ill-appearing, toxic-appearing or diaphoretic.  HENT:     Head: Normocephalic and atraumatic.     Mouth/Throat:     Mouth: Mucous membranes are moist.     Pharynx: Oropharynx is clear. No oropharyngeal exudate or posterior oropharyngeal erythema.     Comments: Multiple dental caries present throughout the mouth. Dentition poor. TTP left lower molar in the back. No swelling underneath the tongue or signs of ludwigs angina. No trismus. No gross abscess or facial swelling. Cardiovascular:     Rate and Rhythm: Normal rate.  Pulmonary:     Effort: Pulmonary effort is normal. No respiratory distress.  Musculoskeletal:        General: Normal range of motion.     Cervical back: Normal range of motion.  Skin:    General: Skin is warm and dry.  Neurological:     General: No focal deficit present.     Mental Status: She is alert.  Psychiatric:        Mood and Affect: Mood normal.        Behavior: Behavior normal.     ED Results / Procedures / Treatments   Labs (all labs ordered are listed, but only abnormal results are displayed) Labs Reviewed - No data to display  EKG None  Radiology No results found.  Procedures Procedures    Medications Ordered in ED Medications - No data to display  ED Course/ Medical Decision Making/ A&P                             Medical Decision Making  Patient presents today with complaints of left lower dental pain.  She is afebrile, nontoxic-appearing, and  in no acute distress with reassuring vital signs.  Physical exam reveals dentition poor throughout with multiple dental caries.  No gross abscess.  Exam unconcerning for Ludwig's angina or spread of infection.  Will treat with clindamycin given her penicillin allergy and anti-inflammatories medicine.  Urged patient to follow-up with dentist.  Given resources for same. Evaluation and diagnostic testing in the emergency department does not suggest an emergent condition requiring admission or immediate intervention beyond what has been performed at this time.  Plan for discharge with close PCP follow-up.  Patient is understanding and amenable with plan, educated on red flag symptoms that would prompt immediate return.  Patient discharged in stable condition.  Final Clinical Impression(s) / ED Diagnoses Final diagnoses:  Pain, dental    Rx / DC Orders ED Discharge Orders          Ordered    clindamycin (CLEOCIN) 150 MG capsule  3 times daily        03/28/22 1520          An After Visit Summary was printed and given to the patient.     Nestor Lewandowsky 03/28/22 1521    Tegeler, Gwenyth Allegra, MD 03/28/22 2129

## 2022-06-02 ENCOUNTER — Encounter (HOSPITAL_BASED_OUTPATIENT_CLINIC_OR_DEPARTMENT_OTHER): Payer: Self-pay

## 2022-06-02 ENCOUNTER — Emergency Department (HOSPITAL_BASED_OUTPATIENT_CLINIC_OR_DEPARTMENT_OTHER)
Admission: EM | Admit: 2022-06-02 | Discharge: 2022-06-03 | Disposition: A | Discharge status: Home / Self Care | Payer: Medicaid Other | Attending: Emergency Medicine | Admitting: Emergency Medicine

## 2022-06-02 ENCOUNTER — Other Ambulatory Visit: Payer: Self-pay

## 2022-06-02 DIAGNOSIS — R112 Nausea with vomiting, unspecified: Secondary | ICD-10-CM | POA: Diagnosis present

## 2022-06-02 DIAGNOSIS — E860 Dehydration: Secondary | ICD-10-CM | POA: Diagnosis not present

## 2022-06-02 DIAGNOSIS — R109 Unspecified abdominal pain: Secondary | ICD-10-CM | POA: Insufficient documentation

## 2022-06-02 DIAGNOSIS — R197 Diarrhea, unspecified: Secondary | ICD-10-CM | POA: Insufficient documentation

## 2022-06-02 LAB — COMPREHENSIVE METABOLIC PANEL
ALT: 17 U/L (ref 0–44)
AST: 19 U/L (ref 15–41)
Albumin: 4 g/dL (ref 3.5–5.0)
Alkaline Phosphatase: 67 U/L (ref 38–126)
Anion gap: 9 (ref 5–15)
BUN: 11 mg/dL (ref 6–20)
CO2: 25 mmol/L (ref 22–32)
Calcium: 8.8 mg/dL — ABNORMAL LOW (ref 8.9–10.3)
Chloride: 105 mmol/L (ref 98–111)
Creatinine, Ser: 0.75 mg/dL (ref 0.44–1.00)
GFR, Estimated: >60 mL/min (ref >60)
Glucose, Bld: 108 mg/dL — ABNORMAL HIGH (ref 70–99)
Potassium: 4.1 mmol/L (ref 3.5–5.1)
Sodium: 139 mmol/L (ref 135–145)
Total Bilirubin: 0.1 mg/dL — ABNORMAL LOW (ref 0.3–1.2)
Total Protein: 7.3 g/dL (ref 6.5–8.1)

## 2022-06-02 LAB — CBC
HCT: 43 % (ref 36.0–46.0)
Hemoglobin: 14.5 g/dL (ref 12.0–15.0)
MCH: 30.2 pg (ref 26.0–34.0)
MCHC: 33.7 g/dL (ref 30.0–36.0)
MCV: 89.6 fL (ref 80.0–100.0)
Platelets: 284 10*3/uL (ref 150–400)
RBC: 4.8 MIL/uL (ref 3.87–5.11)
RDW: 13.5 % (ref 11.5–15.5)
WBC: 7.4 10*3/uL (ref 4.0–10.5)
nRBC: 0 % (ref 0.0–0.2)

## 2022-06-02 LAB — LIPASE, BLOOD: Lipase: 62 U/L — ABNORMAL HIGH (ref 11–51)

## 2022-06-02 NOTE — ED Triage Notes (Signed)
"  I think I have food poisoning" adds that she ate cookout last pm and started with diarrhea, n/v today

## 2022-06-03 LAB — URINALYSIS, MICROSCOPIC (REFLEX)

## 2022-06-03 LAB — URINALYSIS, ROUTINE W REFLEX MICROSCOPIC
Bilirubin Urine: NEGATIVE
Glucose, UA: NEGATIVE mg/dL
Ketones, ur: NEGATIVE mg/dL
Leukocytes,Ua: NEGATIVE
Nitrite: NEGATIVE
Protein, ur: NEGATIVE mg/dL
Specific Gravity, Urine: >=1.03 (ref 1.005–1.030)
pH: 6 (ref 5.0–8.0)

## 2022-06-03 LAB — PREGNANCY, URINE: Preg Test, Ur: NEGATIVE

## 2022-06-03 MED ORDER — SODIUM CHLORIDE 0.9 % IV BOLUS
1000.0000 mL | Freq: Once | INTRAVENOUS | Status: AC
  Administered 2022-06-03: 1000 mL via INTRAVENOUS

## 2022-06-03 MED ORDER — ONDANSETRON HCL 4 MG/2ML IJ SOLN
4.0000 mg | Freq: Once | INTRAMUSCULAR | Status: AC
  Administered 2022-06-03: 4 mg via INTRAVENOUS
  Filled 2022-06-03: qty 2

## 2022-06-03 MED ORDER — ONDANSETRON HCL 4 MG PO TABS: 4.0000 mg | ORAL_TABLET | Freq: Four times a day (QID) | ORAL | 0 refills | Status: DC

## 2022-06-03 NOTE — ED Provider Notes (Signed)
[facility] Provider Note   CSN: [id number] Arrival date & time: 06/02/22  2047     History  Chief Complaint  Patient presents with   Nausea   Emesis   Diarrhea    "I think I have food poisoning" adds that she ate cookout last pm and started with diarrhea, n/v today     Regina Rojas is a 32 y.o. female.  The history is provided by the patient.  Patient reports she ate at a [employer] the previous night.  She woke up in the middle of the night with profuse nonbloody vomiting and diarrhea.  She reports some abdominal discomfort.  No fevers.  No cough or shortness of breath.  She is now starting to feel improved.    Past Medical History:  Diagnosis Date   Anemia    Bipolar disorder (HCC)    Cervical dysplasia    Depression    Sickle cell trait (HCC)     Home Medications Prior to Admission medications   Medication Sig Start Date End Date Taking? Authorizing Provider  ondansetron (ZOFRAN) 4 MG tablet Take 1 tablet (4 mg total) by mouth every 6 (six) hours. 06/03/22  Yes Zadie Rhine, MD      Allergies    Penicillins    Review of Systems   Review of Systems  Constitutional:  Negative for fever.  Gastrointestinal:  Positive for diarrhea and vomiting. Negative for blood in stool.    Physical Exam Updated Vital Signs BP 99/73   Pulse 76   Temp 98.4 F (36.9 C) (Oral)   Resp 18   Ht 1.651 m (5\' 5" )   Wt 87.1 kg   LMP 04/13/2022 Comment: irregular periods  SpO2 98%   BMI 31.95 kg/m  Physical Exam CONSTITUTIONAL: Well developed/well nourished, no distress HEAD: Normocephalic/atraumatic EYES: EOMI/PERRL, no icterus ENMT: Mucous membranes moist NECK: supple no meningeal signs SPINE/BACK:entire spine nontender CV: S1/S2 noted, no murmurs/rubs/gallops noted LUNGS: Lungs are clear to auscultation bilaterally, no apparent distress ABDOMEN: soft, nontender, obese NEURO: Pt is awake/alert/appropriate,  moves all extremitiesx4.  No facial droop.   SKIN: warm, color normal PSYCH: no abnormalities of mood noted, alert and oriented to situation  ED Results / Procedures / Treatments   Labs (all labs ordered are listed, but only abnormal results are displayed) Labs Reviewed  LIPASE, BLOOD - Abnormal; Notable for the following components:      Result Value   Lipase 62 (*)    All other components within normal limits  COMPREHENSIVE METABOLIC PANEL - Abnormal; Notable for the following components:   Glucose, Bld 108 (*)    Calcium 8.8 (*)    Total Bilirubin 0.1 (*)    All other components within normal limits  URINALYSIS, ROUTINE W REFLEX MICROSCOPIC - Abnormal; Notable for the following components:   APPearance HAZY (*)    Hgb urine dipstick TRACE (*)    All other components within normal limits  URINALYSIS, MICROSCOPIC (REFLEX) - Abnormal; Notable for the following components:   Bacteria, UA RARE (*)    All other components within normal limits  CBC  PREGNANCY, URINE    EKG None  Radiology No results found.  Procedures Procedures    Medications Ordered in ED Medications  sodium chloride 0.9 % bolus 1,000 mL (1,000 mLs Intravenous New Bag/Given 06/03/22 0113)  ondansetron (ZOFRAN) injection 4 mg (4 mg Intravenous Given 06/03/22 0113)    ED Course/ Medical Decision Making/ A&P  Medical Decision Making Amount and/or Complexity of Data Reviewed Labs: ordered.  Risk Prescription drug management.   This patient presents to the ED for concern of vomiting and diarrhea, abdominal pain, this involves an extensive number of treatment options, and is a complaint that carries with it a high risk of complications and morbidity.  The differential diagnosis includes but is not limited to cholecystitis, cholelithiasis, pancreatitis, gastritis, peptic ulcer disease, appendicitis, bowel obstruction, bowel perforation, diverticulitis, Gastroenteritis, ectopic  pregnancy, PID  Social Determinants of Health: Patient's impaired access to primary care  increases the complexity of managing their presentation  Additional history obtained: Records reviewed Care Everywhere/External Records  Lab Tests: I Ordered, and personally interpreted labs.  The pertinent results include: Labs overall unremarkable   Medicines ordered and prescription drug management: I ordered medication including Zofran for nausea Reevaluation of the patient after these medicines showed that the patient    improved   Reevaluation: After the interventions noted above, I reevaluated the patient and found that they have :improved  Complexity of problems addressed: Patient's presentation is most consistent with  acute complicated illness/injury requiring diagnostic workup  Disposition: After consideration of the diagnostic results and the patient's response to treatment,  I feel that the patent would benefit from discharge   .   Patient is appropriate for d/c home.  I doubt acute abdominal emergency at this time.  We discussed strict ER return precautions including abdominal pain that migrates to RLQ, fever >100.55F with repetitive vomiting over next 8-12 hours        Final Clinical Impression(s) / ED Diagnoses Final diagnoses:  Nausea vomiting and diarrhea  Dehydration    Rx / DC Orders ED Discharge Orders          Ordered    ondansetron (ZOFRAN) 4 MG tablet  Every 6 hours        06/03/22 0054              Zadie Rhine, MD 06/03/22 0140

## 2022-06-03 NOTE — ED Notes (Signed)
Pt feeling much better, VSS will dc after fluids are complete

## 2022-12-14 ENCOUNTER — Emergency Department (HOSPITAL_BASED_OUTPATIENT_CLINIC_OR_DEPARTMENT_OTHER)
Admission: EM | Admit: 2022-12-14 | Discharge: 2022-12-15 | Disposition: A | Discharge status: Home / Self Care | Payer: Medicaid Other | Attending: Emergency Medicine | Admitting: Emergency Medicine

## 2022-12-14 ENCOUNTER — Encounter (HOSPITAL_BASED_OUTPATIENT_CLINIC_OR_DEPARTMENT_OTHER): Payer: Self-pay

## 2022-12-14 ENCOUNTER — Other Ambulatory Visit: Payer: Self-pay

## 2022-12-14 DIAGNOSIS — R519 Headache, unspecified: Secondary | ICD-10-CM | POA: Diagnosis not present

## 2022-12-14 DIAGNOSIS — N3000 Acute cystitis without hematuria: Secondary | ICD-10-CM | POA: Diagnosis not present

## 2022-12-14 DIAGNOSIS — R350 Frequency of micturition: Secondary | ICD-10-CM | POA: Diagnosis present

## 2022-12-14 LAB — URINALYSIS, MICROSCOPIC (REFLEX): WBC, UA: >50 WBC/hpf (ref 0–5)

## 2022-12-14 LAB — URINALYSIS, ROUTINE W REFLEX MICROSCOPIC
Bilirubin Urine: NEGATIVE
Glucose, UA: NEGATIVE mg/dL
Ketones, ur: 15 mg/dL — AB
Nitrite: POSITIVE — AB
Protein, ur: 30 mg/dL — AB
Specific Gravity, Urine: 1.025 (ref 1.005–1.030)
pH: 5.5 (ref 5.0–8.0)

## 2022-12-14 MED ORDER — CEPHALEXIN 250 MG PO CAPS
500.0000 mg | ORAL_CAPSULE | Freq: Once | ORAL | Status: AC
  Administered 2022-12-14: 500 mg via ORAL
  Filled 2022-12-14: qty 2

## 2022-12-14 MED ORDER — PROCHLORPERAZINE EDISYLATE 10 MG/2ML IJ SOLN
10.0000 mg | Freq: Once | INTRAMUSCULAR | Status: AC
  Administered 2022-12-14: 10 mg via INTRAMUSCULAR
  Filled 2022-12-14: qty 2

## 2022-12-14 MED ORDER — CEPHALEXIN 500 MG PO CAPS: 500.0000 mg | ORAL_CAPSULE | Freq: Four times a day (QID) | ORAL | 0 refills | Status: DC

## 2022-12-14 MED ORDER — DEXAMETHASONE 4 MG PO TABS
10.0000 mg | ORAL_TABLET | Freq: Once | ORAL | Status: AC
Start: 2022-12-14 — End: 2022-12-14
  Administered 2022-12-14: 10 mg via ORAL
  Filled 2022-12-14: qty 3

## 2022-12-14 NOTE — Discharge Instructions (Signed)
Take the antibiotic until gone. Tylenol and/or ibuprofen for headache pain. Both should resolve quickly.   Follow up with your doctor if symptoms persist. Return to the ED with any new or concerning symptoms.

## 2022-12-14 NOTE — ED Triage Notes (Signed)
Pt reports throbbing headache and fatigue since Sunday. Denies congestion, cough, changes in vision. Family recently sick w viral illness.

## 2022-12-14 NOTE — ED Provider Notes (Signed)
[facility] Provider Note   CSN: [id number] Arrival date & time: 12/14/22  2231     History  Chief Complaint  Patient presents with   Headache    Regina Rojas is a 32 y.o. female.  Patient to ED with frontal headache x 3 days described as throbbing. History of headaches in the past similar in nature. The headache intensifies when she goes to a standing position. No fever, congestion, cough. No photophobia, nausea or vomiting. She has taken Tylenol without any relief. She does report urinary frequency and very mild left flank discomfort.   The history is provided by the patient. No language interpreter was used.  Headache      Home Medications Prior to Admission medications   Medication Sig Start Date End Date Taking? Authorizing Provider  cephALEXin (KEFLEX) 500 MG capsule Take 1 capsule (500 mg total) by mouth 4 (four) times daily. 12/14/22  Yes Chauntelle Azpeitia, PA-C  ondansetron (ZOFRAN) 4 MG tablet Take 1 tablet (4 mg total) by mouth every 6 (six) hours. 06/03/22   Zadie Rhine, MD      Allergies    Penicillins    Review of Systems   Review of Systems  Neurological:  Positive for headaches.    Physical Exam Updated Vital Signs BP 122/80 (BP Location: Right Arm)   Pulse (!) 110   Temp 98.3 F (36.8 C) (Oral)   Resp 16   Ht 5\' 5"  (1.651 m)   Wt 89.8 kg   LMP 12/10/2022 (Approximate)   SpO2 99%   BMI 32.95 kg/m  Physical Exam Vitals and nursing note reviewed.  Constitutional:      Appearance: She is well-developed.  HENT:     Head: Normocephalic and atraumatic.     Mouth/Throat:     Mouth: Mucous membranes are moist.  Eyes:     Extraocular Movements: Extraocular movements intact.     Pupils: Pupils are equal, round, and reactive to light.  Cardiovascular:     Rate and Rhythm: Normal rate and regular rhythm.     Heart sounds: No murmur heard. Pulmonary:     Effort: Pulmonary effort is normal.      Breath sounds: No wheezing, rhonchi or rales.  Abdominal:     General: Bowel sounds are normal.     Palpations: Abdomen is soft.  Musculoskeletal:        General: Normal range of motion.     Cervical back: Normal range of motion and neck supple.  Lymphadenopathy:     Cervical: No cervical adenopathy.  Skin:    General: Skin is warm and dry.  Neurological:     Mental Status: She is alert and oriented to person, place, and time.     GCS: GCS eye subscore is 4. GCS verbal subscore is 5. GCS motor subscore is 6.     Cranial Nerves: No cranial nerve deficit or dysarthria.     Sensory: No sensory deficit.     Motor: No weakness.     Coordination: Coordination normal.     Deep Tendon Reflexes: Reflexes normal.     ED Results / Procedures / Treatments   Labs (all labs ordered are listed, but only abnormal results are displayed) Labs Reviewed  URINALYSIS, ROUTINE W REFLEX MICROSCOPIC - Abnormal; Notable for the following components:      Result Value   APPearance HAZY (*)    Hgb urine dipstick SMALL (*)    Ketones, ur  15 (*)    Protein, ur 30 (*)    Nitrite POSITIVE (*)    Leukocytes,Ua SMALL (*)    All other components within normal limits  URINALYSIS, MICROSCOPIC (REFLEX) - Abnormal; Notable for the following components:   Bacteria, UA MANY (*)    All other components within normal limits    EKG None  Radiology No results found.  Procedures Procedures    Medications Ordered in ED Medications  cephALEXin (KEFLEX) capsule 500 mg (has no administration in time range)  prochlorperazine (COMPAZINE) injection 10 mg (10 mg Intramuscular Given 12/14/22 2319)  dexamethasone (DECADRON) tablet 10 mg (10 mg Oral Given 12/14/22 2318)    ED Course/ Medical Decision Making/ A&P Clinical Course as of 12/14/22 2355  Wed Dec 14, 2022  2354 Patient with headache x 3 days. No URI symptoms or fever. Reports urinary frequency. No vomiting. Very well appearing. Normal neuro exam. Found  to have UTI. Start Keflex. Tylenol and/or ibuprofen for headache.  [SU]    Clinical Course User Index [SU] Elpidio Anis, PA-C                                 Medical Decision Making Amount and/or Complexity of Data Reviewed Labs: ordered.  Risk Prescription drug management.           Final Clinical Impression(s) / ED Diagnoses Final diagnoses:  Acute cystitis without hematuria  Acute nonintractable headache, unspecified headache type    Rx / DC Orders ED Discharge Orders          Ordered    cephALEXin (KEFLEX) 500 MG capsule  4 times daily        12/14/22 2353              Elpidio Anis, PA-C 12/14/22 2356    Arby Barrette, MD 12/15/22 1610

## 2023-01-05 ENCOUNTER — Emergency Department (HOSPITAL_BASED_OUTPATIENT_CLINIC_OR_DEPARTMENT_OTHER)
Admission: EM | Admit: 2023-01-05 | Discharge: 2023-01-05 | Disposition: A | Discharge status: Home / Self Care | Payer: Medicaid Other | Attending: Emergency Medicine | Admitting: Emergency Medicine

## 2023-01-05 ENCOUNTER — Other Ambulatory Visit: Payer: Self-pay

## 2023-01-05 ENCOUNTER — Encounter (HOSPITAL_BASED_OUTPATIENT_CLINIC_OR_DEPARTMENT_OTHER): Payer: Self-pay | Admitting: *Deleted

## 2023-01-05 DIAGNOSIS — R1030 Lower abdominal pain, unspecified: Secondary | ICD-10-CM | POA: Diagnosis present

## 2023-01-05 DIAGNOSIS — N12 Tubulo-interstitial nephritis, not specified as acute or chronic: Secondary | ICD-10-CM | POA: Diagnosis not present

## 2023-01-05 LAB — URINALYSIS, MICROSCOPIC (REFLEX)

## 2023-01-05 LAB — URINALYSIS, ROUTINE W REFLEX MICROSCOPIC
Bilirubin Urine: NEGATIVE
Glucose, UA: NEGATIVE mg/dL
Ketones, ur: NEGATIVE mg/dL
Nitrite: NEGATIVE
Protein, ur: NEGATIVE mg/dL
Specific Gravity, Urine: 1.02 (ref 1.005–1.030)
pH: 6.5 (ref 5.0–8.0)

## 2023-01-05 LAB — LIPASE, BLOOD: Lipase: 48 U/L (ref 11–51)

## 2023-01-05 LAB — COMPREHENSIVE METABOLIC PANEL WITH GFR
ALT: 17 U/L (ref 0–44)
AST: 14 U/L — ABNORMAL LOW (ref 15–41)
Albumin: 3.7 g/dL (ref 3.5–5.0)
Alkaline Phosphatase: 68 U/L (ref 38–126)
Anion gap: 6 (ref 5–15)
BUN: 7 mg/dL (ref 6–20)
CO2: 24 mmol/L (ref 22–32)
Calcium: 8.8 mg/dL — ABNORMAL LOW (ref 8.9–10.3)
Chloride: 105 mmol/L (ref 98–111)
Creatinine, Ser: 0.8 mg/dL (ref 0.44–1.00)
GFR, Estimated: >60 mL/min
Glucose, Bld: 88 mg/dL (ref 70–99)
Potassium: 3.9 mmol/L (ref 3.5–5.1)
Sodium: 135 mmol/L (ref 135–145)
Total Bilirubin: 0.6 mg/dL
Total Protein: 7.4 g/dL (ref 6.5–8.1)

## 2023-01-05 LAB — CBC
HCT: 40.4 % (ref 36.0–46.0)
Hemoglobin: 13.7 g/dL (ref 12.0–15.0)
MCH: 29.6 pg (ref 26.0–34.0)
MCHC: 33.9 g/dL (ref 30.0–36.0)
MCV: 87.3 fL (ref 80.0–100.0)
Platelets: 297 K/uL (ref 150–400)
RBC: 4.63 MIL/uL (ref 3.87–5.11)
RDW: 13.7 % (ref 11.5–15.5)
WBC: 13 K/uL — ABNORMAL HIGH (ref 4.0–10.5)
nRBC: 0 % (ref 0.0–0.2)

## 2023-01-05 LAB — PREGNANCY, URINE: Preg Test, Ur: NEGATIVE

## 2023-01-05 MED ORDER — IBUPROFEN 800 MG PO TABS
800.0000 mg | ORAL_TABLET | Freq: Once | ORAL | Status: AC
Start: 2023-01-05 — End: 2023-01-05
  Administered 2023-01-05: 800 mg via ORAL
  Filled 2023-01-05: qty 1

## 2023-01-05 MED ORDER — CIPROFLOXACIN HCL 500 MG PO TABS: 500.0000 mg | ORAL_TABLET | Freq: Two times a day (BID) | ORAL | 0 refills | Status: AC

## 2023-01-05 MED ORDER — CIPROFLOXACIN HCL 500 MG PO TABS
500.0000 mg | ORAL_TABLET | Freq: Once | ORAL | Status: AC
  Administered 2023-01-05: 500 mg via ORAL
  Filled 2023-01-05: qty 1

## 2023-01-05 NOTE — ED Notes (Signed)

## 2023-01-05 NOTE — ED Triage Notes (Signed)
Left sided abdominal pain with radiation into left side.  No n/v/d.  No pain or burning with urination. LBM was yesterday.

## 2023-01-05 NOTE — Discharge Instructions (Addendum)
Your urine shows that you likely have a urinary tract infection.  Given your pain is also in the abdomen and back, there is possibility of the infection standing up to the kidney.  You are being treated for this infection with an antibiotic called ciprofloxacin.  Please take this as prescribed.  Take the full course of the antibiotic even if you start feeling better.  Antibiotics may cause you to have diarrhea.  You were given your first dose here today.  Your urine is being sent off for a culture.  You will be contacted if the antibiotic needs to be changed.  Your kidney, liver, and pancreas labs were normal today.  Your electrolytes were normal.  You had a elevation in your white blood cell count today which can indicate infection.  You may use up to 800mg  ibuprofen every 8 hours as needed for pain.  Do not exceed 2.4g of ibuprofen per day. You were given your first dose here today. Next dose can be no sooner than 12:30am  Please follow-up with your PCP if symptoms do not start to improve by the end of your antibiotics.  Return to the ER for any uncontrolled nausea or vomiting, severe worsening of pain, fevers, any other new or concerning symptoms.

## 2023-01-05 NOTE — ED Provider Notes (Signed)
[place] [facility] Provider Note   CSN: [id number] Arrival date & time: 01/05/23  1356     History  Chief Complaint  Patient presents with   Abdominal Pain    Regina Rojas is a 32 y.o. female with no significant past medical history presents with concern for constant lower abdominal pain and left-sided flank pain that has been ongoing for the past couple of days.  Denies any dysuria, hematuria, or increased frequency.  No nausea vomiting or diarrhea.  Last bowel movement was yesterday.  Denies any fever or chills.  Reports she was seen here a couple weeks ago and treated for a urinary tract infection.  She reports only taking a couple of her antibiotic pills, did not complete the full course.   Abdominal Pain      Home Medications Prior to Admission medications   Medication Sig Start Date End Date Taking? Authorizing Provider  ciprofloxacin (CIPRO) 500 MG tablet Take 1 tablet (500 mg total) by mouth every 12 (twelve) hours for 5 days. 01/05/23 01/10/23 Yes Arabella Merles, PA-C  cephALEXin (KEFLEX) 500 MG capsule Take 1 capsule (500 mg total) by mouth 4 (four) times daily. 12/14/22   Elpidio Anis, PA-C  ondansetron (ZOFRAN) 4 MG tablet Take 1 tablet (4 mg total) by mouth every 6 (six) hours. 06/03/22   Zadie Rhine, MD      Allergies    Penicillins    Review of Systems   Review of Systems  Gastrointestinal:  Positive for abdominal pain.    Physical Exam Updated Vital Signs BP 115/75 (BP Location: Right Arm)   Pulse 90   Temp 98.9 F (37.2 C) (Oral)   Resp 16   LMP 12/10/2022 (Approximate)   SpO2 99%  Physical Exam Vitals and nursing note reviewed.  Constitutional:      General: She is not in acute distress.    Appearance: She is well-developed.  HENT:     Head: Normocephalic and atraumatic.  Eyes:     Conjunctiva/sclera: Conjunctivae normal.  Cardiovascular:     Rate and Rhythm: Normal rate and regular  rhythm.     Heart sounds: No murmur heard. Pulmonary:     Effort: Pulmonary effort is normal. No respiratory distress.     Breath sounds: Normal breath sounds.  Abdominal:     Palpations: Abdomen is soft.     Tenderness: There is no abdominal tenderness. There is left CVA tenderness.  Musculoskeletal:        General: No swelling.     Cervical back: Neck supple.  Skin:    General: Skin is warm and dry.     Capillary Refill: Capillary refill takes less than 2 seconds.  Neurological:     Mental Status: She is alert.  Psychiatric:        Mood and Affect: Mood normal.     ED Results / Procedures / Treatments   Labs (all labs ordered are listed, but only abnormal results are displayed) Labs Reviewed  COMPREHENSIVE METABOLIC PANEL - Abnormal; Notable for the following components:      Result Value   Calcium 8.8 (*)    AST 14 (*)    All other components within normal limits  CBC - Abnormal; Notable for the following components:   WBC 13.0 (*)    All other components within normal limits  URINALYSIS, ROUTINE W REFLEX MICROSCOPIC - Abnormal; Notable for the following components:   APPearance CLOUDY (*)  Hgb urine dipstick TRACE (*)    Leukocytes,Ua TRACE (*)    All other components within normal limits  URINALYSIS, MICROSCOPIC (REFLEX) - Abnormal; Notable for the following components:   Bacteria, UA FEW (*)    All other components within normal limits  LIPASE, BLOOD  PREGNANCY, URINE  URINALYSIS, W/ REFLEX TO CULTURE (INFECTION SUSPECTED)    EKG None  Radiology No results found.  Procedures Procedures    Medications Ordered in ED Medications  ciprofloxacin (CIPRO) tablet 500 mg (500 mg Oral Given 01/05/23 1636)  ibuprofen (ADVIL) tablet 800 mg (800 mg Oral Given 01/05/23 1635)    ED Course/ Medical Decision Making/ A&P                                 Medical Decision Making Amount and/or Complexity of Data Reviewed Labs: ordered.  Risk Prescription drug  management.     Differential diagnosis includes but is not limited to Cholelithiasis, cholangitis, choledocholithiasis, peptic ulcer, gastritis, gastroenteritis, appendicitis, IBS, IBD, DKA, nephrolithiasis, UTI, pyelonephritis, pancreatitis, diverticulitis, mesenteric ischemia, abdominal aortic aneurysm, small bowel obstruction, volvulus, testicular torsion, ovarian torsion, and females of childbearing age pregnancy   ED Course:  Patient well-appearing, no acute distress.  Able to move around comfortably.  Vital signs stable.  She does not have any abdominal tenderness on exam, but does have some left-sided CVA tenderness.  I independently reviewed and interpreted lab results.  CMP without any elevation LFTs, no significant electrolyte abnormalities.  Lipase within normal limits.  CBC with leukocytosis at 13.  Urinalysis with trace hemoglobin and trace leukocytes.  Urine pregnancy negative. I suspect that since she had a recent cystitis that was not fully treated with antibiotics, now with abdominal pain and left lower back pain, and leukocytes on urinalysis, she likely has pyelonephritis.  Low concern for any other acute abdominal pathology at this time. No indication for further imaging at this time. She is stable and able to be treated outpatient. Given first dose of ciprofloxacin here Given ibuprofen for pain   Impression: Pyelonephritis  Disposition:  The patient was discharged home with instructions to take full course of ciprofloxacin.  Ibuprofen as needed for pain.  Follow-up with PCP if symptoms not improved by the end of antibiotic course. Return precautions given.  External records from outside source obtained and reviewed including ER note from 12/14/2022 where she was seen and treated for acute cystitis with Keflex.              Final Clinical Impression(s) / ED Diagnoses Final diagnoses:  Pyelonephritis    Rx / DC Orders ED Discharge Orders           Ordered    ciprofloxacin (CIPRO) 500 MG tablet  Every 12 hours        01/05/23 1638              Arabella Merles, PA-C 01/05/23 1647    Terrilee Files, MD 01/06/23 1016

## 2023-06-21 ENCOUNTER — Encounter (HOSPITAL_BASED_OUTPATIENT_CLINIC_OR_DEPARTMENT_OTHER): Payer: Self-pay | Admitting: Emergency Medicine

## 2023-06-21 ENCOUNTER — Emergency Department (HOSPITAL_BASED_OUTPATIENT_CLINIC_OR_DEPARTMENT_OTHER)

## 2023-06-21 ENCOUNTER — Other Ambulatory Visit: Payer: Self-pay

## 2023-06-21 DIAGNOSIS — Z5321 Procedure and treatment not carried out due to patient leaving prior to being seen by health care provider: Secondary | ICD-10-CM | POA: Diagnosis not present

## 2023-06-21 DIAGNOSIS — S6991XA Unspecified injury of right wrist, hand and finger(s), initial encounter: Secondary | ICD-10-CM | POA: Diagnosis present
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
## 2023-06-22 ENCOUNTER — Emergency Department (HOSPITAL_BASED_OUTPATIENT_CLINIC_OR_DEPARTMENT_OTHER)
Admission: EM | Admit: 2023-06-22 | Discharge: 2023-06-22 | Discharge status: Against Medical Advice | Attending: Emergency Medicine | Admitting: Emergency Medicine

## 2023-06-23 ENCOUNTER — Other Ambulatory Visit: Payer: Self-pay

## 2023-06-23 ENCOUNTER — Emergency Department (HOSPITAL_BASED_OUTPATIENT_CLINIC_OR_DEPARTMENT_OTHER)
Admission: EM | Admit: 2023-06-23 | Discharge: 2023-06-23 | Disposition: A | Discharge status: Home / Self Care | Attending: Emergency Medicine | Admitting: Emergency Medicine

## 2023-06-23 DIAGNOSIS — M20011 Mallet finger of right finger(s): Secondary | ICD-10-CM | POA: Insufficient documentation

## 2023-06-23 DIAGNOSIS — R202 Paresthesia of skin: Secondary | ICD-10-CM | POA: Diagnosis not present

## 2023-06-23 DIAGNOSIS — M79644 Pain in right finger(s): Secondary | ICD-10-CM | POA: Diagnosis present
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
## 2023-06-23 NOTE — Discharge Instructions (Signed)
 Please read and follow all provided instructions.  Your diagnoses today include:  1. Mallet finger of right hand   2. Paresthesia     Tests performed today include: An x-ray of the affected area - does NOT show any broken bones Vital signs. See below for your results today.   Medications prescribed:  Please use over-the-counter NSAID medications (ibuprofen , naproxen ) or Tylenol  (acetaminophen ) as directed on the packaging for pain -- as long as you do not have any reasons avoid these medications. Reasons to avoid NSAID medications include: weak kidneys, a history of bleeding in your stomach or gut, or uncontrolled high blood pressure or previous heart attack. Reasons to avoid Tylenol  include: liver problems or ongoing alcohol use. Never take more than 4000mg  or 8 Extra strength Tylenol  in a 24 hour period.     Take any prescribed medications only as directed.  Home care instructions:  Follow any educational materials contained in this packet Follow R.I.C.E. Protocol: R - rest your injury  I  - use ice on injury without applying directly to skin C - compress injury with bandage or splint E - elevate the injury as much as possible  Follow-up instructions: Please follow-up with the orthopedic hand specialist next week for reexam of your hand as you may have torn an extensor tendon of your finger.  Return instructions:  Please return if your fingers are numb or tingling, appear gray or blue, or you have severe pain (also elevate the arm and loosen splint or wrap if you were given one) Please return to the Emergency Department if you experience worsening symptoms.  Please return if you have any other emergent concerns.  Additional Information:  Your vital signs today were: BP 118/87 (BP Location: Left Arm)   Pulse 89   Temp 97.6 F (36.4 C)   Resp 16   Ht 5\' 5"  (1.651 m)   Wt 87.5 kg   SpO2 99%   BMI 32.10 kg/m  If your blood pressure (BP) was elevated above 135/85 this visit,  please have this repeated by your doctor within one month. --------------

## 2023-07-02 ENCOUNTER — Encounter (HOSPITAL_BASED_OUTPATIENT_CLINIC_OR_DEPARTMENT_OTHER): Payer: Self-pay | Admitting: Emergency Medicine

## 2023-07-02 ENCOUNTER — Emergency Department (HOSPITAL_BASED_OUTPATIENT_CLINIC_OR_DEPARTMENT_OTHER)
Admission: EM | Admit: 2023-07-02 | Discharge: 2023-07-02 | Discharge status: Against Medical Advice | Attending: Emergency Medicine | Admitting: Emergency Medicine

## 2023-07-02 ENCOUNTER — Other Ambulatory Visit: Payer: Self-pay

## 2023-07-02 DIAGNOSIS — Z5321 Procedure and treatment not carried out due to patient leaving prior to being seen by health care provider: Secondary | ICD-10-CM | POA: Insufficient documentation

## 2023-07-02 DIAGNOSIS — K0889 Other specified disorders of teeth and supporting structures: Secondary | ICD-10-CM | POA: Insufficient documentation

## 2023-07-02 MED ORDER — NAPROXEN 250 MG PO TABS
500.0000 mg | ORAL_TABLET | Freq: Once | ORAL | Status: DC
  Filled 2023-07-02: qty 2

## 2023-07-02 MED ORDER — LIDOCAINE VISCOUS HCL 2 % MT SOLN
15.0000 mL | Freq: Once | OROMUCOSAL | Status: DC
  Filled 2023-07-02: qty 15

## 2023-07-02 MED ORDER — CLINDAMYCIN HCL 300 MG PO CAPS: 300.0000 mg | ORAL_CAPSULE | Freq: Four times a day (QID) | ORAL | 0 refills | Status: DC

## 2023-07-02 MED ORDER — CLINDAMYCIN HCL 150 MG PO CAPS
300.0000 mg | ORAL_CAPSULE | Freq: Once | ORAL | Status: DC
  Filled 2023-07-02: qty 2

## 2023-07-02 MED ORDER — NAPROXEN 500 MG PO TABS: 500.0000 mg | ORAL_TABLET | Freq: Two times a day (BID) | ORAL | 0 refills | Status: DC

## 2023-07-02 MED ORDER — ACETAMINOPHEN 325 MG PO TABS
650.0000 mg | ORAL_TABLET | Freq: Four times a day (QID) | ORAL | Status: AC | PRN
  Administered 2023-07-02: 650 mg via ORAL
  Filled 2023-07-02: qty 2

## 2023-07-02 NOTE — ED Notes (Signed)
 Called patient for medication admin and room x3. Unable to locate patient in surrounding areas.

## 2023-07-02 NOTE — ED Triage Notes (Signed)
 Patient reports left lower dental pain x1 day. Denies fevers. Denies recent dental work.

## 2023-11-23 ENCOUNTER — Other Ambulatory Visit: Payer: Self-pay | Admitting: Medical Genetics

## 2023-11-30 ENCOUNTER — Ambulatory Visit (INDEPENDENT_AMBULATORY_CARE_PROVIDER_SITE_OTHER): Admitting: Family Medicine

## 2023-11-30 ENCOUNTER — Encounter: Payer: Self-pay | Admitting: Family Medicine

## 2023-11-30 ENCOUNTER — Other Ambulatory Visit (HOSPITAL_COMMUNITY)
Admission: RE | Admit: 2023-11-30 | Discharge: 2023-11-30 | Disposition: A | Discharge status: Home / Self Care | Source: Ambulatory Visit | Attending: Family Medicine | Admitting: Family Medicine

## 2023-11-30 VITALS — BP 112/70 | HR 85 | Temp 98.1°F | Ht 65.0 in | Wt 201.0 lb

## 2023-11-30 DIAGNOSIS — N6452 Nipple discharge: Secondary | ICD-10-CM

## 2023-11-30 DIAGNOSIS — Z124 Encounter for screening for malignant neoplasm of cervix: Secondary | ICD-10-CM

## 2023-11-30 DIAGNOSIS — Z114 Encounter for screening for human immunodeficiency virus [HIV]: Secondary | ICD-10-CM

## 2023-11-30 DIAGNOSIS — Z1159 Encounter for screening for other viral diseases: Secondary | ICD-10-CM

## 2023-11-30 DIAGNOSIS — Z202 Contact with and (suspected) exposure to infections with a predominantly sexual mode of transmission: Secondary | ICD-10-CM | POA: Diagnosis not present

## 2023-11-30 DIAGNOSIS — E663 Overweight: Secondary | ICD-10-CM | POA: Diagnosis not present

## 2023-11-30 DIAGNOSIS — Z7689 Persons encountering health services in other specified circumstances: Secondary | ICD-10-CM

## 2023-11-30 LAB — CBC WITH DIFFERENTIAL/PLATELET
Basophils Absolute: 0 K/uL (ref 0.0–0.1)
Basophils Relative: 0.4 % (ref 0.0–3.0)
Eosinophils Absolute: 0.1 K/uL (ref 0.0–0.7)
Eosinophils Relative: 0.8 % (ref 0.0–5.0)
HCT: 42.2 % (ref 36.0–46.0)
Hemoglobin: 14.4 g/dL (ref 12.0–15.0)
Lymphocytes Relative: 26.6 % (ref 12.0–46.0)
Lymphs Abs: 2 K/uL (ref 0.7–4.0)
MCHC: 34 g/dL (ref 30.0–36.0)
MCV: 89.5 fl (ref 78.0–100.0)
Monocytes Absolute: 0.5 K/uL (ref 0.1–1.0)
Monocytes Relative: 6 % (ref 3.0–12.0)
Neutro Abs: 5.1 K/uL (ref 1.4–7.7)
Neutrophils Relative %: 66.2 % (ref 43.0–77.0)
Platelets: 291 K/uL (ref 150.0–400.0)
RBC: 4.72 Mil/uL (ref 3.87–5.11)
RDW: 13.7 % (ref 11.5–15.5)
WBC: 7.7 K/uL (ref 4.0–10.5)

## 2023-11-30 LAB — COMPREHENSIVE METABOLIC PANEL WITH GFR
ALT: 18 U/L (ref 0–35)
AST: 19 U/L (ref 0–37)
Albumin: 4.4 g/dL (ref 3.5–5.2)
Alkaline Phosphatase: 69 U/L (ref 39–117)
BUN: 8 mg/dL (ref 6–23)
CO2: 27 meq/L (ref 19–32)
Calcium: 9.4 mg/dL (ref 8.4–10.5)
Chloride: 103 meq/L (ref 96–112)
Creatinine, Ser: 0.67 mg/dL (ref 0.40–1.20)
GFR: 113.74 mL/min (ref >60.00)
Glucose, Bld: 83 mg/dL (ref 70–99)
Potassium: 4 meq/L (ref 3.5–5.1)
Sodium: 137 meq/L (ref 135–145)
Total Bilirubin: 0.4 mg/dL (ref 0.2–1.2)
Total Protein: 7.6 g/dL (ref 6.0–8.3)

## 2023-11-30 LAB — LIPID PANEL
Cholesterol: 192 mg/dL (ref 0–200)
HDL: 39.2 mg/dL (ref >39.00)
LDL Cholesterol: 136 mg/dL — ABNORMAL HIGH (ref 0–99)
NonHDL: 152.67
Total CHOL/HDL Ratio: 5
Triglycerides: 82 mg/dL (ref 0.0–149.0)
VLDL: 16.4 mg/dL (ref 0.0–40.0)

## 2023-11-30 LAB — TSH: TSH: 1.04 u[IU]/mL (ref 0.35–5.50)

## 2023-11-30 LAB — HEMOGLOBIN A1C: Hgb A1c MFr Bld: 5.9 % (ref 4.6–6.5)

## 2023-11-30 NOTE — Patient Instructions (Addendum)
-  It was nice to meet you and look forward to taking care of you. -Ordered diagnostic mammogram for nipple discharge. Please call the office or send a MyChart message if you do not receive a phone call or a MyChart message about appointment in 2 weeks.  -Placed a referral to gynecology. Please call the office or send a MyChart message if you do not receive a phone call or a MyChart message about appointment in 2 weeks.  -Ordered labs. Office will call with lab results and will be available via MyChart.  -Follow up in 1 year for a physical.

## 2023-11-30 NOTE — Progress Notes (Signed)
 New Patient Office Visit  Subjective   Patient ID: Regina Rojas, female    DOB: Jul 01, 1990  Age: 33 y.o. MRN: [id number]  CC:  Chief Complaint  Patient presents with   Establish Care    HPI Regina Rojas presents to establish care with new provider.   Patients previous primary care provider: [facility]. Last seen in 2023.   Specialist: None   Patient is having nipple discharge from both breast. Started months. Only notices when stimulating in the shower or applying a lot of pressure to the breast. Discharge color: light green-thick  to thin white. Denies breast pain.   Patient is requesting STD testing. Not having any symptoms.  Outpatient Encounter Medications as of 11/30/2023  Medication Sig   [DISCONTINUED] cephALEXin  (KEFLEX ) 500 MG capsule Take 1 capsule (500 mg total) by mouth 4 (four) times daily.   [DISCONTINUED] clindamycin  (CLEOCIN ) 300 MG capsule Take 1 capsule (300 mg total) by mouth 4 (four) times daily. X 7 days   [DISCONTINUED] naproxen  (NAPROSYN ) 500 MG tablet Take 1 tablet (500 mg total) by mouth 2 (two) times daily with a meal.   [DISCONTINUED] ondansetron  (ZOFRAN ) 4 MG tablet Take 1 tablet (4 mg total) by mouth every 6 (six) hours.   No facility-administered encounter medications on file as of 11/30/2023.    Past Medical History:  Diagnosis Date   Allergy 1995   Anemia    Anxiety 2018   Bipolar disorder (HCC)    Cervical dysplasia    Depression 2004   Sickle cell trait     Past Surgical History:  Procedure Laterality Date   COLPOSCOPY     GYNECOLOGIC CRYOSURGERY      Family History  Problem Relation Age of Onset   Diabetes Mother    Hypertension Mother    Hypertension Sister    Thyroid disease Maternal Aunt    Cancer Maternal Grandmother        Thyroid cancer   Diabetes Maternal Grandmother    Heart disease Maternal Grandmother    Cancer Maternal Great Grandparent        Breast    Social History    Socioeconomic History   Marital status: Single    Spouse name: Not on file   Number of children: 0   Years of education: Not on file   Highest education level: Some college, no degree  Occupational History   Not on file  Tobacco Use   Smoking status: Every Day    Current packs/day: 2.00    Average packs/day: 2.0 packs/day for 15.0 years (30.0 ttl pk-yrs)    Types: Cigars, E-cigarettes, Cigarettes   Smokeless tobacco: Never  Vaping Use   Vaping status: Never Used  Substance and Sexual Activity   Alcohol use: Yes    Alcohol/week: 7.0 standard drinks of alcohol    Types: 5 Glasses of wine, 2 Shots of liquor per week    Comment: Holidays   Drug use: Yes    Frequency: 5.0 times per week    Types: Marijuana   Sexual activity: Yes    Birth control/protection: None  Other Topics Concern   Not on file  Social History Narrative   Not on file   Social Drivers of Health   Financial Resource Strain: Low Risk  (11/23/2023)   Overall Financial Resource Strain (CARDIA)    Difficulty of Paying Living Expenses: Not hard at all  Food Insecurity: No Food Insecurity (11/23/2023)   Hunger Vital  Sign    Worried About Programme Researcher, Broadcasting/film/video in the Last Year: Never true    Ran Out of Food in the Last Year: Never true  Transportation Needs: No Transportation Needs (11/23/2023)   PRAPARE - Administrator, Civil Service (Medical): No    Lack of Transportation (Non-Medical): No  Physical Activity: Sufficiently Active (11/23/2023)   Exercise Vital Sign    Days of Exercise per Week: 5 days    Minutes of Exercise per Session: 40 min  Stress: Stress Concern Present (11/23/2023)   [employer] of Occupational Health - Occupational Stress Questionnaire    Feeling of Stress: To some extent  Social Connections: Moderately Integrated (11/23/2023)   Social Connection and Isolation Panel    Frequency of Communication with Friends and Family: More than three times a week    Frequency of  Social Gatherings with Friends and Family: Once a week    Attends Religious Services: More than 4 times per year    Active Member of [employer] or Organizations: No    Attends Banker Meetings: Not on file    Marital Status: Living with partner  Intimate Partner Violence: Not At Risk (11/30/2023)   Humiliation, Afraid, Rape, and Kick questionnaire    Fear of Current or Ex-Partner: No    Emotionally Abused: No    Physically Abused: No    Sexually Abused: No    ROS See HPI above    Objective  BP 112/70   Pulse 85   Temp 98.1 F (36.7 C) (Oral)   Ht 5' 5 (1.651 m)   Wt 201 lb (91.2 kg)   LMP 11/06/2023   SpO2 96%   Breastfeeding No   BMI 33.45 kg/m   Physical Exam Vitals reviewed. Exam conducted with a chaperone present.  Constitutional:      General: She is not in acute distress.    Appearance: Normal appearance. She is obese. She is not ill-appearing, toxic-appearing or diaphoretic.  HENT:     Head: Normocephalic and atraumatic.  Eyes:     General:        Right eye: No discharge.        Left eye: No discharge.     Conjunctiva/sclera: Conjunctivae normal.  Cardiovascular:     Rate and Rhythm: Normal rate and regular rhythm.     Heart sounds: Normal heart sounds. No murmur heard.    No friction rub. No gallop.  Pulmonary:     Effort: Pulmonary effort is normal. No respiratory distress.     Breath sounds: Normal breath sounds.  Chest:  Breasts:    Breasts are symmetrical.     Right: Skin change (4 o'clock at the nipple-healing scar) present. No swelling, bleeding or tenderness.     Left: Nipple discharge (Patient squeezing hard around breast) present. No swelling, bleeding, skin change or tenderness.  Musculoskeletal:        General: Normal range of motion.  Skin:    General: Skin is warm and dry.  Neurological:     General: No focal deficit present.     Mental Status: She is alert and oriented to person, place, and time. Mental status is at baseline.   Psychiatric:        Mood and Affect: Mood normal.        Behavior: Behavior normal.        Thought Content: Thought content normal.        Judgment: Judgment normal.  Assessment & Plan:  Nipple discharge -     MM 3D DIAGNOSTIC MAMMOGRAM BILATERAL BREAST; Future -     TSH -     Prolactin  Encounter for assessment of STD exposure -     Hepatitis C antibody -     HIV Antibody (routine testing w rflx) -     HSV(herpes simplex vrs) 1+2 ab-IgG -     Cervicovaginal ancillary only  Cervical cancer screening -     Ambulatory referral to Obstetrics / Gynecology  Over weight -     CBC with Differential/Platelet -     Comprehensive metabolic panel with GFR -     Hemoglobin A1c -     Lipid panel -     TSH  Need for hepatitis C screening test -     Hepatitis C antibody  Encounter for screening for HIV -     HIV Antibody (routine testing w rflx)  Encounter to establish care  1.Review health maintenance:  -Cervical cancer screening: Referral to GYN  -Tdap vaccine: 2023 from previous PCP -Hep B vaccine: Probably had previously  -Hep C screening: Order  -HPV vaccine: Not had  -Influenza vaccine: Declines  -PNA vaccine: Declines  2.Ordered diagnostic mammogram for nipple discharge. Please call the office or send a MyChart message if you do not receive a phone call or a MyChart message about appointment in 2 weeks.  3.Ordered labs. Office will call with lab results and will be available via MyChart.   Return in about 1 year (around 11/29/2024) for physical.   Gurkirat Basher, NP

## 2023-12-01 LAB — CERVICOVAGINAL ANCILLARY ONLY
Comment: NEGATIVE
Comment: NEGATIVE
Comment: NEGATIVE
Comment: NEGATIVE
Comment: NEGATIVE
Comment: NORMAL

## 2023-12-01 LAB — HEPATITIS C ANTIBODY: Hepatitis C Ab: NONREACTIVE

## 2023-12-01 LAB — PROLACTIN: Prolactin: 10.3 ng/mL

## 2023-12-01 LAB — HIV ANTIBODY (ROUTINE TESTING W REFLEX)
HIV 1&2 Ab, 4th Generation: NONREACTIVE
HIV FINAL INTERPRETATION: NEGATIVE

## 2023-12-05 ENCOUNTER — Ambulatory Visit: Payer: Self-pay | Admitting: Family Medicine

## 2024-02-07 ENCOUNTER — Other Ambulatory Visit (HOSPITAL_COMMUNITY): Payer: Self-pay
